# Patient Record
Sex: Female | Born: 1945 | Race: White | Hispanic: No | Marital: Married | Smoking: Never smoker
Health system: Southern US, Community
[De-identification: ages and names within clinical notes are randomized; demographics above are authoritative.]

## PROBLEM LIST (undated history)

## (undated) DIAGNOSIS — E049 Nontoxic goiter, unspecified: Secondary | ICD-10-CM

## (undated) DIAGNOSIS — K219 Gastro-esophageal reflux disease without esophagitis: Secondary | ICD-10-CM

## (undated) DIAGNOSIS — Z789 Other specified health status: Secondary | ICD-10-CM

## (undated) DIAGNOSIS — K5900 Constipation, unspecified: Secondary | ICD-10-CM

## (undated) DIAGNOSIS — M722 Plantar fascial fibromatosis: Secondary | ICD-10-CM

## (undated) DIAGNOSIS — M1990 Unspecified osteoarthritis, unspecified site: Secondary | ICD-10-CM

## (undated) DIAGNOSIS — C801 Malignant (primary) neoplasm, unspecified: Secondary | ICD-10-CM

## (undated) DIAGNOSIS — J309 Allergic rhinitis, unspecified: Secondary | ICD-10-CM

## (undated) DIAGNOSIS — M810 Age-related osteoporosis without current pathological fracture: Secondary | ICD-10-CM

## (undated) DIAGNOSIS — IMO0002 Reserved for concepts with insufficient information to code with codable children: Secondary | ICD-10-CM

## (undated) DIAGNOSIS — E785 Hyperlipidemia, unspecified: Secondary | ICD-10-CM

## (undated) HISTORY — PX: BASAL CELL CARCINOMA EXCISION: SHX1214

## (undated) HISTORY — PX: TONSILLECTOMY: SUR1361

## (undated) HISTORY — PX: NOSE SURGERY: SHX723

## (undated) HISTORY — PX: CATARACT EXTRACTION: SUR2

---

## 2007-06-27 HISTORY — PX: RETINAL LASER PROCEDURE: SHX2339

## 2007-07-26 ENCOUNTER — Ambulatory Visit (HOSPITAL_COMMUNITY): Admission: RE | Admit: 2007-07-26 | Discharge: 2007-07-27 | Payer: Self-pay | Admitting: Ophthalmology

## 2011-02-08 NOTE — Op Note (Signed)
Jody Howe, Jody Howe              ACCOUNT NO.:  [id number]   MEDICAL RECORD NO.:  [id number]          PATIENT TYPE:  AMB   LOCATION:  SDS                          FACILITY:  [facility]   PHYSICIAN:  John D. Ashley Royalty, M.D. DATE OF BIRTH:  09-Dec-1945   DATE OF PROCEDURE:  07/26/2007  DATE OF DISCHARGE:                               OPERATIVE REPORT   ADMISSION DIAGNOSIS:  Preretinal fibrosis, right eye.   PROCEDURE:  Pars plana vitrectomy, membrane peel, retinal  photocoagulation, right eye.   SURGEON:  Beulah Gandy. Ashley Royalty, M.D.   ASSISTANT:  Rosalie Doctor, M.A.   ANESTHESIA:  General.   DETAILS:  Usual prep and drape.  The indirect ophthalmoscope laser was  moved into place, and 319 burns were placed in two rows around the  retinal periphery with a power of 420 milliwatts, 1000 microns each, and  0.07 seconds each.  Sclerotomies were marked at 8, 10, and 2 o'clock.  A  three-layered incision was made in each of these locations.  Infusion at  8 o'clock.  Contact lens ring anchored into place at 6 and 12 o'clock.  Provisc placed on the corneal surface, and the flat contact lens was  placed.  Pars plana vitrectomy was begun just behind the crystalline  lens.  Vitrectomy was carried posteriorly.  White membranes were  encountered.  The retina was thrown into folds in the macular region in  a radial fashion.  A D-shaped ring of fibrotic tissue cover the macula  and extended to the disk.  The diamond dusted membrane scraper was used  to engage the membrane and roll an edge up from the lower arcade.  The  20-gauge forceps were then used to grasp the membrane and peel it from  its attachment to the disk, the papillomacular bundle, and the macular  region.  The membrane was peeled across it in one piece.  The membrane  was withdrawn from the eye with 20-gauge forceps.  The vitrectomy was  continued out into the peripheral vitreous area on the vitreous base.  A  30-degree prismatic lens was moved  into place, and all vitreous was  removed down to the vitreous base.  Once this was accomplished, the  instruments were removed from the eye.  9-0 nylon was used to close the  sclerotomy sites.  They were tested and found to be tight.  9-0 nylon  was used to close the sites.  The conjunctiva was closed with wet-field  cautery.  Polymyxin and gentamicin were irrigated into Tenon's space.  Marcaine was injected around the globe for postop pain.  Decadron 10 mg  was injected into the lower subconjunctival space.  Closing pressure was  10 with a Risk manager.   COMPLICATIONS:  None.   DURATION:  1 hour.  The patient was awakened and taken to recovery in  satisfactory condition.      Beulah Gandy. Ashley Royalty, M.D.  Electronically Signed    JDM/MEDQ  D:  07/26/2007  T:  07/27/2007  Job:  [id number]

## 2011-07-06 LAB — CBC
HCT: 41.4
Platelets: 238
WBC: 4.8

## 2011-07-06 LAB — BASIC METABOLIC PANEL
BUN: 13
GFR calc non Af Amer: >60
Potassium: 3.8
Sodium: 138

## 2011-10-19 ENCOUNTER — Other Ambulatory Visit: Payer: Self-pay | Admitting: Endocrinology

## 2011-10-19 DIAGNOSIS — E042 Nontoxic multinodular goiter: Secondary | ICD-10-CM

## 2011-10-26 ENCOUNTER — Encounter (HOSPITAL_COMMUNITY)
Admission: RE | Admit: 2011-10-26 | Discharge: 2011-10-26 | Disposition: A | Discharge status: Home / Self Care | Payer: BC Managed Care – PPO | Source: Ambulatory Visit | Attending: Endocrinology | Admitting: Endocrinology

## 2011-10-26 DIAGNOSIS — E042 Nontoxic multinodular goiter: Secondary | ICD-10-CM | POA: Insufficient documentation

## 2011-10-26 MED ORDER — SODIUM PERTECHNETATE TC 99M INJECTION
10.0000 | Freq: Once | INTRAVENOUS | Status: AC | PRN
  Administered 2011-10-26: 10 via INTRAVENOUS

## 2012-03-28 DIAGNOSIS — Z85828 Personal history of other malignant neoplasm of skin: Secondary | ICD-10-CM | POA: Insufficient documentation

## 2013-06-18 ENCOUNTER — Encounter (INDEPENDENT_AMBULATORY_CARE_PROVIDER_SITE_OTHER): Payer: 59 | Admitting: Ophthalmology

## 2013-06-18 DIAGNOSIS — H2510 Age-related nuclear cataract, unspecified eye: Secondary | ICD-10-CM

## 2013-06-18 DIAGNOSIS — H35379 Puckering of macula, unspecified eye: Secondary | ICD-10-CM

## 2013-06-18 DIAGNOSIS — H43819 Vitreous degeneration, unspecified eye: Secondary | ICD-10-CM

## 2013-10-17 ENCOUNTER — Other Ambulatory Visit: Payer: BC Managed Care – PPO

## 2013-10-22 ENCOUNTER — Ambulatory Visit: Payer: BC Managed Care – PPO | Admitting: Endocrinology

## 2013-10-28 ENCOUNTER — Encounter: Payer: Self-pay | Admitting: Endocrinology

## 2013-10-28 ENCOUNTER — Ambulatory Visit (INDEPENDENT_AMBULATORY_CARE_PROVIDER_SITE_OTHER): Payer: Commercial Managed Care - PPO | Admitting: Endocrinology

## 2013-10-28 VITALS — BP 122/60 | HR 77 | Temp 98.2°F | Resp 16 | Ht 59.5 in | Wt 147.4 lb

## 2013-10-28 DIAGNOSIS — E041 Nontoxic single thyroid nodule: Secondary | ICD-10-CM | POA: Insufficient documentation

## 2013-10-28 NOTE — Progress Notes (Signed)
Patient ID: Jody Howe, female   DOB: November 18, 1945, 68 y.o.   MRN: [id number]    Reason for Appointment: Goiter, followup    History of Present Illness:   The patient's thyroid enlargement was first discovered in 2008 Apparently her thyroid swelling was increased a couple of years later and her ultrasound showed a dominant right-sided 2.7 cm nodule. Biopsy revealed colloid and benign follicular cells has also scattered Hurthle cells in October 2010 Apparently on followup ultrasound exams she appeared to have coalescence of her nodules and in 2012 the entire lobe appeared to be a single nodular structure measuring 4.2 cm in size. This appeared cold on the nuclear scan in 09/2011; the record area was mostly in the right inferior lobe. The isthmus was showing normal uptake. She also had a 1.9 cm nodule to the right of the isthmus on the ultrasound.  She has had no difficulty with swallowing  Does not feel like she has any choking sensation in her neck or pressure in any position or when lying down. She was last seen in 09/2012 when her thyroid nodule appeared to be 4.5 cm smooth and slightly firm on the right side. Also had a 2 cm slightly firm nodule in the right of the isthmus.  TSH done by PCP in 08/2013 was 1.1  No results found for this basename: TSH       Medication List       This list is accurate as of: 10/28/13 11:59 PM.  Always use your most recent med list.               alendronate 70 MG tablet  Commonly known as:  FOSAMAX  Take 70 mg by mouth once a week. Take with a full glass of water on an empty stomach.        Allergies:  Allergies  Allergen Reactions  . Sulfa Antibiotics Hives    No past medical history on file.  No past surgical history on file.  No family history on file.  Social History:  reports that she has never smoked. She has never used smokeless tobacco. Her alcohol and drug histories are not on file.   Review of Systems:  There is no  history of high blood pressure.             No  history of Diabetes.             Examination:   BP 122/60  Pulse 77  Temp(Src) 98.2 F (36.8 C)  Resp 16  Ht 4' 11.5" (1.511 m)  Wt 147 lb 6.4 oz (66.86 kg)  BMI 29.28 kg/m2  SpO2 98%   General Appearance: pleasant,          Eyes: No abnormal prominence       Neck: The thyroid is enlarged, appears to have a 3.5-4 cm nodule on the medial right lobe extending to the isthmus which is smooth and not nodular. No separate nodules felt otherwise. Left lobe not enlarged Neurological: REFLEXES: at biceps are normal.        Assessment/Plan:  Multinodular goiter since at least 2008 Clinically her right-sided thyroid enlargement and appears to be the same or smaller compared to a year ago Since she already has had a biopsy indicating a benign process and the nodule is stable we'll continue to follow her annually She is also euthyroid consistently on her TSH measurements   [facility] 11/04/2013

## 2014-06-19 ENCOUNTER — Ambulatory Visit (INDEPENDENT_AMBULATORY_CARE_PROVIDER_SITE_OTHER): Payer: 59 | Admitting: Ophthalmology

## 2014-07-07 ENCOUNTER — Ambulatory Visit (INDEPENDENT_AMBULATORY_CARE_PROVIDER_SITE_OTHER): Payer: 59 | Admitting: Ophthalmology

## 2014-07-07 DIAGNOSIS — H35373 Puckering of macula, bilateral: Secondary | ICD-10-CM

## 2014-07-07 DIAGNOSIS — H2512 Age-related nuclear cataract, left eye: Secondary | ICD-10-CM

## 2014-07-07 DIAGNOSIS — H43813 Vitreous degeneration, bilateral: Secondary | ICD-10-CM

## 2014-11-26 ENCOUNTER — Encounter: Payer: Self-pay | Admitting: Endocrinology

## 2014-11-26 ENCOUNTER — Other Ambulatory Visit: Payer: Self-pay | Admitting: *Deleted

## 2014-11-26 ENCOUNTER — Other Ambulatory Visit (INDEPENDENT_AMBULATORY_CARE_PROVIDER_SITE_OTHER): Payer: Commercial Managed Care - PPO

## 2014-11-26 ENCOUNTER — Ambulatory Visit (INDEPENDENT_AMBULATORY_CARE_PROVIDER_SITE_OTHER): Payer: Commercial Managed Care - PPO | Admitting: Endocrinology

## 2014-11-26 VITALS — BP 112/70 | HR 63 | Temp 98.2°F | Resp 14 | Ht 59.5 in | Wt 132.6 lb

## 2014-11-26 DIAGNOSIS — E041 Nontoxic single thyroid nodule: Secondary | ICD-10-CM

## 2014-11-26 LAB — TSH: TSH: 1.54 u[IU]/mL (ref 0.35–4.50)

## 2014-11-26 LAB — T4, FREE: FREE T4: 0.73 ng/dL (ref 0.60–1.60)

## 2014-11-26 NOTE — Progress Notes (Signed)
Patient ID: Jody Howe, female   DOB: 06-26-1946, 69 y.o.   MRN: [id number]    Reason for Appointment: Goiter, followup    History of Present Illness:   The patient's thyroid enlargement was first discovered in 2008 Apparently her thyroid swelling was increased a couple of years later and her ultrasound showed a dominant right-sided 2.7 cm nodule. Biopsy revealed colloid and benign follicular cells has also scattered Hurthle cells in October 2010 Apparently on followup ultrasound exams she appeared to have coalescence of her nodules and in 2012 the entire lobe appeared to be a single nodular structure measuring 4.2 cm in size. This appeared cold on the nuclear scan in 09/2011; the record area was mostly in the right inferior lobe. The isthmus was showing normal uptake. She also had a 1.9 cm nodule to the right of the isthmus on the ultrasound.  She has had no difficulty with swallowing   Does not feel have  any choking sensation in her neck or pressure in any position or when lying down. She was last seen in 09/2013 when her thyroid nodule appeared to be 3.5-4 cm smooth and firm on the right side.  Also had a 2 cm slightly firm nodule in the right of the isthmus previously which was not palpable.  She thinks that her nodule is not as prominent when she looks at it  TSH done by PCP in 08/2013 was 1.1, no recent labs available  Lab Results  Component Value Date   TSH 1.54 11/26/2014       Medication List       This list is accurate as of: 11/26/14  5:18 PM.  Always use your most recent med list.               calcium citrate-vitamin D 315-200 MG-UNIT per tablet  Commonly known as:  CITRACAL+D  Take 2 tablets by mouth 2 (two) times daily.        Allergies:  Allergies  Allergen Reactions  . Sulfa Antibiotics Hives    No past medical history on file.  No past surgical history on file.  No family history on file.  Social History:  reports that she has never smoked.  She has never used smokeless tobacco. Her alcohol and drug histories are not on file.   Review of Systems:  There is no history of high blood pressure.             No  history of Diabetes.             Examination:   BP 112/70 mmHg  Pulse 63  Temp(Src) 98.2 F (36.8 C)  Resp 14  Ht 4' 11.5" (1.511 m)  Wt 132 lb 9.6 oz (60.147 kg)  BMI 26.34 kg/m2  SpO2 98%   General Appearance: pleasant, looks well         Neck: The thyroid is enlarged on the right side and has a 3-3.5 cm nodule on the right lobe medially extending to the isthmus; this is smooth and not nodular. No separate nodules felt otherwise. Left lobe not enlarged Neurological: REFLEXES: at biceps are normal.      Assessment/Plan:  Multinodular goiter since at least 2008 Clinically her right-sided thyroid enlargement and appears to be progressively smaller compared to 2 years ago Since she already has had a biopsy indicating a benign etiology   She is also euthyroid consistently on her TSH measurements and this will be rechecked She can be followed every  2 years now   [facility] 11/26/2014

## 2014-11-27 NOTE — Progress Notes (Signed)
Quick Note:  Please let patient know that the lab result is normal and no further action needed ______ 

## 2015-07-13 ENCOUNTER — Ambulatory Visit (INDEPENDENT_AMBULATORY_CARE_PROVIDER_SITE_OTHER): Payer: Medicare Other | Admitting: Ophthalmology

## 2015-07-13 DIAGNOSIS — H43813 Vitreous degeneration, bilateral: Secondary | ICD-10-CM

## 2015-07-13 DIAGNOSIS — H35373 Puckering of macula, bilateral: Secondary | ICD-10-CM | POA: Diagnosis not present

## 2015-09-22 ENCOUNTER — Ambulatory Visit (INDEPENDENT_AMBULATORY_CARE_PROVIDER_SITE_OTHER): Payer: Medicare Other | Admitting: Sports Medicine

## 2015-09-22 ENCOUNTER — Encounter: Payer: Self-pay | Admitting: Sports Medicine

## 2015-09-22 ENCOUNTER — Ambulatory Visit: Payer: Self-pay

## 2015-09-22 DIAGNOSIS — M79672 Pain in left foot: Secondary | ICD-10-CM | POA: Diagnosis not present

## 2015-09-22 DIAGNOSIS — Q828 Other specified congenital malformations of skin: Secondary | ICD-10-CM

## 2015-09-22 NOTE — Progress Notes (Signed)
Patient ID: Jody Howe, female   DOB: 07-06-1946, 69 y.o.   MRN: [id number] Subjective: Jody Howe is a 69 y.o. female patient who presents to office for evaluation ofLeft foot pain. Patient complains of pain at the callus lesions present Left foot at the ball. Patient states that she tried trimming once; denies any other pedal complaints.   Patient Active Problem List   Diagnosis Date Noted  . Thyroid nodule 10/28/2013   Current Outpatient Prescriptions on File Prior to Visit  Medication Sig Dispense Refill  . calcium citrate-vitamin D (CITRACAL+D) 315-200 MG-UNIT per tablet Take 2 tablets by mouth 2 (two) times daily.     No current facility-administered medications on file prior to visit.   Allergies  Allergen Reactions  . Sulfa Antibiotics Hives     Objective:  General: Alert and oriented x3 in no acute distress  Dermatology: Keratotic lesions present sub met 3-4 on left with central nucleated core noted, no signs of infection, no webspace macerations, no ecchymosis bilateral, all nails x 10 are well manicured.  Vascular: Dorsalis Pedis and Posterior Tibial pedal pulses 2/4, Capillary Fill Time 3 seconds, + pedal hair growth bilateral, no edema bilateral lower extremities, Temperature gradient within normal limits.  Neurology: Johney Maine sensation intact via light touch bilateral.  Musculoskeletal: Mild tenderness with palpation at the keratotic lesions on Left plantar 3-4 met heads, mild hammertoes with distal fat pad migration, Muscular strength 5/5 in all groups without pain or limitation on range of motion.     Assessment and Plan: Problem List Items Addressed This Visit    None    Visit Diagnoses    Left foot pain    -  Primary    Porokeratosis           -Complete examination performed -Discussed treatement options -Parred keratoic lesions using a chisel blade; treated the area with Salinocaine covered with moleskin; Advised patient to keep intact for 1 day  and to replace as needed -Recommend good supportive shoes for foot type and advised OTC inserts to slow recurrence and daily skin emollinets. Recommend good supportive work boots as well and good foot hygiene.  -Patient to return to office 6 weeks or sooner if condition worsens.  Landis Martins, DPM

## 2015-11-03 ENCOUNTER — Ambulatory Visit: Payer: Medicare Other | Admitting: Sports Medicine

## 2015-11-10 ENCOUNTER — Ambulatory Visit: Payer: Medicare Other | Admitting: Sports Medicine

## 2015-11-17 ENCOUNTER — Ambulatory Visit (INDEPENDENT_AMBULATORY_CARE_PROVIDER_SITE_OTHER): Payer: Medicare Other | Admitting: Sports Medicine

## 2015-11-17 ENCOUNTER — Encounter: Payer: Self-pay | Admitting: Sports Medicine

## 2015-11-17 DIAGNOSIS — M79672 Pain in left foot: Secondary | ICD-10-CM

## 2015-11-17 DIAGNOSIS — Q828 Other specified congenital malformations of skin: Secondary | ICD-10-CM | POA: Diagnosis not present

## 2015-11-17 NOTE — Progress Notes (Signed)
Patient ID: Jody Howe, female   DOB: 1946/07/29, 70 y.o.   MRN: [id number] Subjective: Jody Howe is a 70 y.o. female patient who returns to office for evaluation of Left foot pain/callus. Patient states that she has not had a recurrence of pain since last encounter; denies any other pedal complaints.   Patient Active Problem List   Diagnosis Date Noted  . Thyroid nodule 10/28/2013   Current Outpatient Prescriptions on File Prior to Visit  Medication Sig Dispense Refill  . calcium citrate-vitamin D (CITRACAL+D) 315-200 MG-UNIT per tablet Take 2 tablets by mouth 2 (two) times daily.     No current facility-administered medications on file prior to visit.   Allergies  Allergen Reactions  . Sulfa Antibiotics Hives     Objective:  General: Alert and oriented x3 in no acute distress  Dermatology: Resolved Keratotic lesions sub met 3-4 on left, no signs of infection, no webspace macerations, no ecchymosis bilateral, all nails x 10 are well manicured.  Vascular: Dorsalis Pedis and Posterior Tibial pedal pulses 2/4, Capillary Fill Time 3 seconds, + pedal hair growth bilateral, no edema bilateral lower extremities, Temperature gradient within normal limits.  Neurology: Johney Maine sensation intact via light touch bilateral.  Musculoskeletal: No tenderness with palpation at the Left plantar 3-4 met heads, mild hammertoes with distal fat pad migration, Muscular strength 5/5 in all groups without pain or limitation on range of motion.     Assessment and Plan: Problem List Items Addressed This Visit    None    Visit Diagnoses    Left foot pain    -  Primary    Resolved    Porokeratosis        Improved       -Complete examination performed -Discussed long term care -Recommend good supportive shoes for foot type and advised OTC inserts to slow recurrence and daily skin emollinets. Recommend good supportive work boots as well and good foot hygiene.  -Patient to return to office as  needed or sooner if condition worsens.  Landis Martins, DPM

## 2015-11-17 NOTE — Patient Instructions (Signed)
[employer]'s Healthy Feet foot cream daily after showering

## 2016-02-12 ENCOUNTER — Encounter: Payer: Self-pay | Admitting: *Deleted

## 2016-02-15 ENCOUNTER — Encounter: Payer: Self-pay | Admitting: *Deleted

## 2016-02-15 ENCOUNTER — Ambulatory Visit: Payer: Medicare Other | Admitting: Certified Registered Nurse Anesthetist

## 2016-02-15 ENCOUNTER — Ambulatory Visit
Admission: RE | Admit: 2016-02-15 | Discharge: 2016-02-15 | Disposition: A | Discharge status: Home / Self Care | Payer: Medicare Other | Source: Ambulatory Visit | Attending: Gastroenterology | Admitting: Gastroenterology

## 2016-02-15 ENCOUNTER — Encounter
Admission: RE | Disposition: A | Discharge status: Home / Self Care | Payer: Self-pay | Source: Ambulatory Visit | Attending: Gastroenterology

## 2016-02-15 DIAGNOSIS — Z79899 Other long term (current) drug therapy: Secondary | ICD-10-CM | POA: Diagnosis not present

## 2016-02-15 DIAGNOSIS — Z86010 Personal history of colonic polyps: Secondary | ICD-10-CM | POA: Insufficient documentation

## 2016-02-15 DIAGNOSIS — Z1211 Encounter for screening for malignant neoplasm of colon: Secondary | ICD-10-CM | POA: Insufficient documentation

## 2016-02-15 DIAGNOSIS — M1990 Unspecified osteoarthritis, unspecified site: Secondary | ICD-10-CM | POA: Insufficient documentation

## 2016-02-15 HISTORY — DX: Plantar fascial fibromatosis: M72.2

## 2016-02-15 HISTORY — DX: Constipation, unspecified: K59.00

## 2016-02-15 HISTORY — DX: Age-related osteoporosis without current pathological fracture: M81.0

## 2016-02-15 HISTORY — DX: Unspecified osteoarthritis, unspecified site: M19.90

## 2016-02-15 HISTORY — DX: Gastro-esophageal reflux disease without esophagitis: K21.9

## 2016-02-15 HISTORY — DX: Allergic rhinitis, unspecified: J30.9

## 2016-02-15 HISTORY — DX: Other specified health status: Z78.9

## 2016-02-15 HISTORY — DX: Reserved for concepts with insufficient information to code with codable children: IMO0002

## 2016-02-15 HISTORY — PX: COLONOSCOPY WITH PROPOFOL: SHX5780

## 2016-02-15 HISTORY — DX: Nontoxic goiter, unspecified: E04.9

## 2016-02-15 SURGERY — COLONOSCOPY WITH PROPOFOL
Anesthesia: General

## 2016-02-15 MED ORDER — LIDOCAINE HCL (CARDIAC) 20 MG/ML IV SOLN
INTRAVENOUS | Status: DC | PRN
  Administered 2016-02-15: 20 mg via INTRAVENOUS

## 2016-02-15 MED ORDER — EPHEDRINE SULFATE 50 MG/ML IJ SOLN
INTRAMUSCULAR | Status: DC | PRN
  Administered 2016-02-15 (×2): 5 mg via INTRAVENOUS

## 2016-02-15 MED ORDER — SODIUM CHLORIDE 0.9 % IV SOLN
INTRAVENOUS | Status: DC
Start: 2016-02-15 — End: 2016-02-15
  Administered 2016-02-15: 09:00:00 via INTRAVENOUS

## 2016-02-15 MED ORDER — SODIUM CHLORIDE 0.9 % IV SOLN: INTRAVENOUS | Status: DC

## 2016-02-15 MED ORDER — PROPOFOL 10 MG/ML IV BOLUS
INTRAVENOUS | Status: DC | PRN
  Administered 2016-02-15: 50 mg via INTRAVENOUS

## 2016-02-15 MED ORDER — PROPOFOL 500 MG/50ML IV EMUL
INTRAVENOUS | Status: DC | PRN
  Administered 2016-02-15: 150 ug/kg/min via INTRAVENOUS

## 2016-02-15 NOTE — Anesthesia Preprocedure Evaluation (Signed)
Anesthesia Evaluation  Patient identified by MRN, date of birth, ID band Patient awake    Reviewed: Allergy & Precautions, H&P , NPO status , Patient's Chart, lab work & pertinent test results, reviewed documented beta blocker date and time   Airway Mallampati: II   Neck ROM: full    Dental  (+) Poor Dentition, Teeth Intact   Pulmonary neg pulmonary ROS,    Pulmonary exam normal        Cardiovascular negative cardio ROS Normal cardiovascular exam Rhythm:regular Rate:Normal     Neuro/Psych negative neurological ROS  negative psych ROS   GI/Hepatic negative GI ROS, Neg liver ROS,   Endo/Other  negative endocrine ROS  Renal/GU negative Renal ROS  negative genitourinary   Musculoskeletal   Abdominal   Peds  Hematology negative hematology ROS (+)   Anesthesia Other Findings Past Medical History:   Constipation                                                 Goiter                                                       Allergic rhinitis                                            Osteoporosis                                                 Plantar fasciitis                                            Cystocele                                                      Comment:mild on exam   Arthritis                                                    Medical history non-contributory                           Past Surgical History:   TONSILLECTOMY                                                 RETINAL LASER PROCEDURE  10/08          Comment:laster retinal surgery   BASAL CELL CARCINOMA EXCISION                                 CATARACT EXTRACTION                                           NOSE SURGERY                                                    Comment:excision and flap - [facility] left nose BMI    Body Mass Index   26.24 kg/m 2     Reproductive/Obstetrics                              Anesthesia Physical Anesthesia Plan  ASA: II  Anesthesia Plan: General   Post-op Pain Management:    Induction:   Airway Management Planned:   Additional Equipment:   Intra-op Plan:   Post-operative Plan:   Informed Consent: I have reviewed the patients History and Physical, chart, labs and discussed the procedure including the risks, benefits and alternatives for the proposed anesthesia with the patient or authorized representative who has indicated his/her understanding and acceptance.   Dental Advisory Given  Plan Discussed with: CRNA  Anesthesia Plan Comments:         Anesthesia Quick Evaluation

## 2016-02-15 NOTE — Op Note (Signed)
[facility]astroenterology Patient Name: Jody Howe Procedure Date: 02/15/2016 9:35 AM MRN: [id number] Account #: [id number] Date of Birth: 05-05-1946 Admit Type: Outpatient Age: 70 Room: [facility] ENDO ROOM 4 Gender: Female Note Status: Finalized Procedure:            Colonoscopy Indications:          Personal history of colonic polyps Providers:            Lupita Dawn. Candace Cruise, MD Referring MD:         Renee Rival (Referring MD) Medicines:            Monitored Anesthesia Care Complications:        No immediate complications. Procedure:            Pre-Anesthesia Assessment:                       - Prior to the procedure, a History and Physical was                        performed, and patient medications, allergies and                        sensitivities were reviewed. The patient's tolerance of                        previous anesthesia was reviewed.                       - The risks and benefits of the procedure and the                        sedation options and risks were discussed with the                        patient. All questions were answered and informed                        consent was obtained.                       - After reviewing the risks and benefits, the patient                        was deemed in satisfactory condition to undergo the                        procedure.                       After obtaining informed consent, the colonoscope was                        passed under direct vision. Throughout the procedure,                        the patient's blood pressure, pulse, and oxygen                        saturations were monitored continuously. The  Colonoscope was introduced through the anus and                        advanced to the the cecum, identified by appendiceal                        orifice and ileocecal valve. The colonoscopy was                        performed without difficulty. The patient  tolerated the                        procedure well. The quality of the bowel preparation                        was good. Findings:      The colon (entire examined portion) appeared normal. Impression:           - The entire examined colon is normal.                       - No specimens collected. Recommendation:       - Discharge patient to home.                       - Repeat colonoscopy in 5 years for surveillance.                       - The findings and recommendations were discussed with                        the patient. Procedure Code(s):    --- Professional ---                       [phone], Colonoscopy, flexible; diagnostic, including                        collection of specimen(s) by brushing or washing, when                        performed (separate procedure) Diagnosis Code(s):    --- Professional ---                       Z86.010, Personal history of colonic polyps CPT copyright 2016 [employer] Medical Association. All rights reserved. The codes documented in this report are preliminary and upon coder review may  be revised to meet current compliance requirements. Hulen Luster, MD 02/15/2016 9:58:25 AM This report has been signed electronically. Number of Addenda: 0 Note Initiated On: 02/15/2016 9:35 AM Scope Withdrawal Time: 0 hours 5 minutes 46 seconds  Total Procedure Duration: 0 hours 11 minutes 2 seconds       [facility]

## 2016-02-15 NOTE — Anesthesia Postprocedure Evaluation (Signed)
Anesthesia Post Note  Patient: Jody Howe  Procedure(s) Performed: Procedure(s) (LRB): COLONOSCOPY WITH PROPOFOL (N/A)  Patient location during evaluation: PACU Anesthesia Type: General Level of consciousness: awake and alert Pain management: pain level controlled Vital Signs Assessment: post-procedure vital signs reviewed and stable Respiratory status: spontaneous breathing, nonlabored ventilation, respiratory function stable and patient connected to nasal cannula oxygen Cardiovascular status: blood pressure returned to baseline and stable Postop Assessment: no signs of nausea or vomiting Anesthetic complications: no    Last Vitals:  Filed Vitals:   02/15/16 1020 02/15/16 1030  BP: 95/55 106/58  Pulse: 62 59  Temp:    Resp: 11 19    Last Pain: There were no vitals filed for this visit.               Molli Barrows

## 2016-02-15 NOTE — Transfer of Care (Signed)
Immediate Anesthesia Transfer of Care Note  Patient: Jody Howe  Procedure(s) Performed: Procedure(s): COLONOSCOPY WITH PROPOFOL (N/A)  Patient Location: PACU  Anesthesia Type:General  Level of Consciousness: awake, alert  and oriented  Airway & Oxygen Therapy: Patient Spontanous Breathing and Patient connected to nasal cannula oxygen  Post-op Assessment: Report given to RN and Post -op Vital signs reviewed and stable  Post vital signs: Reviewed and stable  Last Vitals:  Filed Vitals:   02/15/16 0858 02/15/16 1000  BP: 101/63 90/55  Pulse: 58 71  Temp: 36.9 C 35.9 C  Resp: 18 18    Last Pain: There were no vitals filed for this visit.       Complications: No apparent anesthesia complications

## 2016-02-15 NOTE — H&P (Addendum)
    Primary Care Physician:  Renee Rival, NP Primary Gastroenterologist:  Dr. Candace Cruise  Pre-Procedure History & Physical: HPI:  Jody Howe is a 70 y.o. female is here for an colonoscopy.   Past Medical History  Diagnosis Date  . Constipation   . Goiter   . Allergic rhinitis   . Osteoporosis   . Plantar fasciitis   . Cystocele     mild on exam  . Arthritis   . Medical history non-contributory     Past Surgical History  Procedure Laterality Date  . Tonsillectomy    . Retinal laser procedure  10/08    laster retinal surgery  . Basal cell carcinoma excision    . Cataract extraction    . Nose surgery      excision and flap - [facility] left nose    Prior to Admission medications   Medication Sig Start Date End Date Taking? Authorizing Provider  calcium citrate-vitamin D (CITRACAL+D) 315-200 MG-UNIT per tablet Take 2 tablets by mouth 2 (two) times daily.   Yes Historical Provider, MD  Multiple Vitamins-Minerals (MULTIVITAMIN WITH MINERALS) tablet Take by mouth.   Yes Historical Provider, MD  amoxicillin-clavulanate (AUGMENTIN) 875-125 MG tablet Reported on 02/15/2016 11/12/15   Historical Provider, MD  trimethoprim-polymyxin b (POLYTRIM) ophthalmic solution Reported on 02/15/2016 11/12/15   Historical Provider, MD    Allergies as of 01/26/2016 - Review Complete 11/17/2015  Allergen Reaction Noted  . Sulfa antibiotics Hives 10/28/2013    History reviewed. No pertinent family history.  Social History   Social History  . Marital Status: Married    Spouse Name: N/A  . Number of Children: N/A  . Years of Education: N/A   Occupational History  . Not on file.   Social History Main Topics  . Smoking status: Never Smoker   . Smokeless tobacco: Never Used  . Alcohol Use: No  . Drug Use: No  . Sexual Activity: Not on file   Other Topics Concern  . Not on file   Social History Narrative    Review of Systems: See HPI, otherwise negative ROS  Physical Exam: BP  101/63 mmHg  Pulse 58  Temp(Src) 98.5 F (36.9 C) (Tympanic)  Resp 18  Ht 4\' 11"  (1.499 m)  Wt 130 lb (58.968 kg)  BMI 26.24 kg/m2  SpO2 100% General:   Alert,  pleasant and cooperative in NAD Head:  Normocephalic and atraumatic. Neck:  Supple; no masses or thyromegaly. Lungs:  Clear throughout to auscultation.    Heart:  Regular rate and rhythm. Abdomen:  Soft, nontender and nondistended. Normal bowel sounds, without guarding, and without rebound.   Neurologic:  Alert and  oriented x4;  grossly normal neurologically.  Impression/Plan: Jody Howe is here for an colonoscopy to be performed for personal hx of colon polyps  Risks, benefits, limitations, and alternatives regarding colonoscopy have been reviewed with the patient.  Questions have been answered.  All parties agreeable.   Fredrica Capano, Lupita Dawn, MD  02/15/2016, 9:34 AM

## 2016-02-16 ENCOUNTER — Encounter: Payer: Self-pay | Admitting: Gastroenterology

## 2016-06-08 DIAGNOSIS — M5441 Lumbago with sciatica, right side: Secondary | ICD-10-CM | POA: Insufficient documentation

## 2016-06-08 DIAGNOSIS — M5136 Other intervertebral disc degeneration, lumbar region: Secondary | ICD-10-CM | POA: Insufficient documentation

## 2016-06-08 DIAGNOSIS — M51369 Other intervertebral disc degeneration, lumbar region without mention of lumbar back pain or lower extremity pain: Secondary | ICD-10-CM | POA: Insufficient documentation

## 2016-06-08 DIAGNOSIS — R899 Unspecified abnormal finding in specimens from other organs, systems and tissues: Secondary | ICD-10-CM | POA: Insufficient documentation

## 2016-07-18 ENCOUNTER — Ambulatory Visit (INDEPENDENT_AMBULATORY_CARE_PROVIDER_SITE_OTHER): Payer: Medicare Other | Admitting: Ophthalmology

## 2016-07-20 ENCOUNTER — Ambulatory Visit (INDEPENDENT_AMBULATORY_CARE_PROVIDER_SITE_OTHER): Payer: Medicare Other | Admitting: Ophthalmology

## 2016-08-22 ENCOUNTER — Ambulatory Visit (INDEPENDENT_AMBULATORY_CARE_PROVIDER_SITE_OTHER): Payer: Medicare Other | Admitting: Ophthalmology

## 2016-08-22 DIAGNOSIS — H35373 Puckering of macula, bilateral: Secondary | ICD-10-CM | POA: Diagnosis not present

## 2016-08-22 DIAGNOSIS — H43812 Vitreous degeneration, left eye: Secondary | ICD-10-CM

## 2016-11-28 ENCOUNTER — Encounter: Payer: Self-pay | Admitting: Podiatry

## 2016-11-28 ENCOUNTER — Ambulatory Visit (INDEPENDENT_AMBULATORY_CARE_PROVIDER_SITE_OTHER): Payer: Medicare Other | Admitting: Podiatry

## 2016-11-28 DIAGNOSIS — M79672 Pain in left foot: Secondary | ICD-10-CM | POA: Diagnosis not present

## 2016-11-28 DIAGNOSIS — Q828 Other specified congenital malformations of skin: Secondary | ICD-10-CM | POA: Diagnosis not present

## 2016-11-28 NOTE — Progress Notes (Signed)
   Subjective:    Patient ID: Jody Howe, female    DOB: 1946-01-12, 71 y.o.   MRN: [id number]  HPI this patient presents to the office with chief complaint of pain from skin lesion on the bottom of her left foot.She says that this keeps returning even after previous treatment.  She says that it is painful walking and wearing her shoes. He presents the office today for definitive evaluation and treatment    Review of Systems  All other systems reviewed and are negative.      Objective:   Physical Exam GENERAL APPEARANCE: Alert, conversant. Appropriately groomed. No acute distress.  VASCULAR: Pedal pulses are  palpable at  [facility] and PT bilateral.  Capillary refill time is immediate to all digits,  Normal temperature gradient.  Digital hair growth is present bilateral  NEUROLOGIC: sensation is normal to 5.07 monofilament at 5/5 sites bilateral.  Light touch is intact bilateral, Muscle strength normal.  MUSCULOSKELETAL: acceptable muscle strength, tone and stability bilateral.  Intrinsic muscluature intact bilateral.  Rectus appearance of foot and digits noted bilateral.   DERMATOLOGIC: skin color, texture, and turgor are within normal limits.  No preulcerative lesions or ulcers  are seen, no interdigital maceration noted.  No open lesions present.  Digital nails are asymptomatic. No drainage noted.Porokeratosis sub 2,3 left foot.         Assessment & Plan:  Porokeratosis  Left foot  Debride  Porokeratosis  RTC prn   Gardiner Barefoot DPM

## 2017-05-16 LAB — TSH: TSH: 0.01 — AB (ref <=5.90)

## 2017-10-03 ENCOUNTER — Encounter: Payer: Self-pay | Admitting: "Endocrinology

## 2017-10-03 ENCOUNTER — Ambulatory Visit: Payer: Medicare Other | Admitting: "Endocrinology

## 2017-10-03 VITALS — BP 128/67 | HR 85 | Ht 59.0 in | Wt 135.0 lb

## 2017-10-03 DIAGNOSIS — E0590 Thyrotoxicosis, unspecified without thyrotoxic crisis or storm: Secondary | ICD-10-CM | POA: Diagnosis not present

## 2017-10-03 DIAGNOSIS — E041 Nontoxic single thyroid nodule: Secondary | ICD-10-CM

## 2017-10-03 DIAGNOSIS — E0520 Thyrotoxicosis with toxic multinodular goiter without thyrotoxic crisis or storm: Secondary | ICD-10-CM | POA: Insufficient documentation

## 2017-10-03 NOTE — Progress Notes (Signed)
Consult Note                                            10/03/2017, 5:24 PM   Subjective:    Patient ID: Jody Howe, female    DOB: 09/13/1946, PCP Renee Rival, NP   Past Medical History:  Diagnosis Date  . Allergic rhinitis   . Arthritis   . Constipation   . Cystocele    mild on exam  . Goiter   . Medical history non-contributory   . Osteoporosis   . Plantar fasciitis    Past Surgical History:  Procedure Laterality Date  . BASAL CELL CARCINOMA EXCISION    . CATARACT EXTRACTION    . COLONOSCOPY WITH PROPOFOL N/A 02/15/2016   Procedure: COLONOSCOPY WITH PROPOFOL;  Surgeon: Hulen Luster, MD;  Location: [facility] ENDOSCOPY;  Service: Gastroenterology;  Laterality: N/A;  . NOSE SURGERY     excision and flap - [facility] left nose  . RETINAL LASER PROCEDURE  10/08   laster retinal surgery  . TONSILLECTOMY     Social History   Socioeconomic History  . Marital status: Married    Spouse name: None  . Number of children: None  . Years of education: None  . Highest education level: None  Social Needs  . Financial resource strain: None  . Food insecurity - worry: None  . Food insecurity - inability: None  . Transportation needs - medical: None  . Transportation needs - non-medical: None  Occupational History  . None  Tobacco Use  . Smoking status: Never Smoker  . Smokeless tobacco: Never Used  Substance and Sexual Activity  . Alcohol use: No  . Drug use: No  . Sexual activity: None  Other Topics Concern  . None  Social History Narrative  . None   Outpatient Encounter Medications as of 10/03/2017  Medication Sig  . denosumab (PROLIA) 60 MG/ML SOLN injection Inject 60 mg into the skin every 6 (six) months. Administer in upper arm, thigh, or abdomen  . calcium citrate-vitamin D (CITRACAL+D) 315-200 MG-UNIT per tablet Take 2 tablets by mouth 2 (two) times daily.  . [DISCONTINUED] amoxicillin-clavulanate (AUGMENTIN) 875-125 MG tablet Reported on 02/15/2016  .  [DISCONTINUED] Aspartame POWD Take by mouth.  . [DISCONTINUED] cyclobenzaprine (FLEXERIL) 5 MG tablet Begin only at night time, as may cause drowsiness. 1 tab PM for muscles spasm for 10 days  . [DISCONTINUED] Glucosamine-Chondroitin 500-400 MG CAPS Take by mouth.  . [DISCONTINUED] MAGNESIUM PO Take by mouth.  . [DISCONTINUED] Multiple Vitamins-Minerals (MULTIVITAMIN WITH MINERALS) tablet Take by mouth.  . [DISCONTINUED] Potassium Gluconate 550 (90 K) MG TABS Take by mouth.  . [DISCONTINUED] Red Yeast Rice Extract 600 MG CAPS Take by mouth.  . [DISCONTINUED] trimethoprim-polymyxin b (POLYTRIM) ophthalmic solution Reported on 02/15/2016  . [DISCONTINUED] Turmeric Curcumin 500 MG CAPS Take by mouth.   No facility-administered encounter medications on file as of 10/03/2017.    ALLERGIES: Allergies  Allergen Reactions  . Ceftin [Cefuroxime Axetil] Other (See Comments)  . Other Hives  . Sulfa Antibiotics Hives    VACCINATION STATUS: Immunization History  Administered Date(s) Administered  . Influenza-Unspecified 07/09/2014    HPI Jody Howe is 72 y.o. female who presents today with a medical history as above. she is being seen in consultation for history of multinodular goiter, recent subclinical  hyperthyroidism requested by Renee Rival, NP.  she has been dealing with symptoms of  mild tremors, weight loss of 10 pounds over the last 2 years ( although she thinks she lost waist due to her increased physical activity). She denies palpitations, sleep disturbance, heat or cold intolerance. She denies dysphagia, shortness of breath, nor voice change. she denies family history of thyroid dysfunction. No family history of thyroid cancer. - Around 2013, she was found to have cold nodule on her right thyroid which reportedly was biopsied and showed benign findings. She does not have any recent thyroid ultrasound nor thyroid imaging. - On 05/16/2017 she was found to have TSH < 0.006, free T4  1.63. She has history of osteoporosis on prolia treatment, currently after 2 doses.  Review of Systems  Constitutional: + weight loss, no fatigue, no subjective hyperthermia, no subjective hypothermia Eyes: no blurry vision, no xerophthalmia ENT: no sore throat, + nodules palpated  in her thyroid,  no dysphagia/odynophagia, no hoarseness Cardiovascular: no Chest Pain, no Shortness of Breath, no palpitations, no leg swelling Respiratory: no cough, no SOB Gastrointestinal: no Nausea/Vomiting/Diarhhea Musculoskeletal: no muscle/joint aches Skin: no rashes Neurological: + tremors ( did not notice until physical exam), no numbness, no tingling, no dizziness Psychiatric: no depression, no anxiety  Objective:    BP 128/67   Pulse 85   Ht '4\' 11"'  (1.499 m)   Wt 135 lb (61.2 kg)   BMI 27.27 kg/m   Wt Readings from Last 3 Encounters:  10/03/17 135 lb (61.2 kg)  02/15/16 130 lb (59 kg)  11/26/14 132 lb 9.6 oz (60.1 kg)    Physical Exam  Constitutional: +  slightly overweight for height,  not in acute distress, normal state of mind Eyes: PERRLA, EOMI, no exophthalmos ENT: moist mucous membranes, +  palpable asymmetric thyromegaly, right lobe larger than left lobe,  no cervical lymphadenopathy Cardiovascular: normal precordial activity, Regular Rate and Rhythm, no Murmur/Rubs/Gallops Respiratory:  adequate breathing efforts, no gross chest deformity, Clear to auscultation bilaterally Gastrointestinal: abdomen soft, Non -tender, No distension, Bowel Sounds present Musculoskeletal: no gross deformities, strength intact in all four extremities Skin: moist, warm, no rashes Neurological: ++ tremor with outstretched hands, Deep tendon reflexes normal in all four extremities.  CMP ( most recent) CMP     Component Value Date/Time   NA 138 07/26/2007 0931   K 3.8 07/26/2007 0931   CL 106 07/26/2007 0931   CO2 25 07/26/2007 0931   GLUCOSE 92 07/26/2007 0931   BUN 13 07/26/2007 0931    CREATININE 0.61 07/26/2007 0931   CALCIUM 8.6 07/26/2007 0931   GFRNONAA >60 07/26/2007 0931   GFRAA  07/26/2007 0931    >60        The eGFR has been calculated using the MDRD equation. This calculation has not been validated in all clinical   Review of her 2013 thyroid uptake and scan revealed a large cold nodule which was seen 2 arise from the inferior pole of the right lobe of the thyroid-reportedly biopsied with benign outcomes. Cytology reports are not available to review.   Lab Results  Component Value Date   TSH 0.01 (A) 05/16/2017   TSH 1.54 11/26/2014   FREET4 0.73 11/26/2014     Assessment & Plan:   1. Thyroid nodule 2. Subclinical hyperthyroidism  - Jody Howe  is being seen at a kind request of Renee Rival, NP- I have reviewed her available thyroid records and clinically evaluated the patient. -  She has history of nodular goiter, currently with subclinical hyperthyroidism. - She does not have recent workup, she will need repeat for thyroid function test as well as thyroid/neck ultrasound. She will return in 2 weeks to discuss results and make treatment decisions if necessary.   If her labs are suggestive of hyperthyroidism, she would be considered for thyroid uptake and scan to confirm diagnosis.  - I did not initiate any new prescriptions today. - I advised her to follow up with her PCP for treatment of osteoporosis currently on prolia a status post 2 doses.   - I advised patient to maintain close follow up with Renee Rival, NP for primary care needs. Follow up plan: Return in about 2 weeks (around 10/17/2017) for labs today, Thyroid / Neck Ultrasound.   Glade Lloyd, MD [facility] [address] [place], [place] [address] Phone: [phone]  Fax: [phone]     10/03/2017, 5:24 PM  This note was partially dictated with voice recognition software. Similar sounding words can be  transcribed inadequately or may not  be corrected upon review.

## 2017-10-04 LAB — T3, FREE: T3, Free: 8.7 pg/mL — ABNORMAL HIGH (ref 2.3–4.2)

## 2017-10-04 LAB — T4, FREE: FREE T4: 2.3 ng/dL — AB (ref 0.8–1.8)

## 2017-10-04 LAB — TSH: TSH: 0.01 mIU/L — ABNORMAL LOW (ref 0.40–4.50)

## 2017-10-04 LAB — THYROGLOBULIN ANTIBODY: Thyroglobulin Ab: <1 IU/mL (ref <=1)

## 2017-10-04 LAB — THYROID PEROXIDASE ANTIBODY: Thyroperoxidase Ab SerPl-aCnc: 1 IU/mL (ref <9)

## 2017-10-13 ENCOUNTER — Other Ambulatory Visit: Payer: Self-pay | Admitting: "Endocrinology

## 2017-10-13 ENCOUNTER — Ambulatory Visit (HOSPITAL_COMMUNITY)
Admission: RE | Admit: 2017-10-13 | Discharge: 2017-10-13 | Disposition: A | Discharge status: Home / Self Care | Payer: Medicare Other | Source: Ambulatory Visit | Attending: "Endocrinology | Admitting: "Endocrinology

## 2017-10-13 DIAGNOSIS — E041 Nontoxic single thyroid nodule: Secondary | ICD-10-CM | POA: Diagnosis not present

## 2017-10-17 ENCOUNTER — Ambulatory Visit (INDEPENDENT_AMBULATORY_CARE_PROVIDER_SITE_OTHER): Payer: Medicare Other | Admitting: "Endocrinology

## 2017-10-17 ENCOUNTER — Encounter: Payer: Self-pay | Admitting: "Endocrinology

## 2017-10-17 VITALS — BP 138/69 | HR 84 | Ht 59.0 in | Wt 133.0 lb

## 2017-10-17 DIAGNOSIS — E041 Nontoxic single thyroid nodule: Secondary | ICD-10-CM

## 2017-10-17 DIAGNOSIS — E0590 Thyrotoxicosis, unspecified without thyrotoxic crisis or storm: Secondary | ICD-10-CM

## 2017-10-17 NOTE — Progress Notes (Signed)
Consult Note                                            10/17/2017, 5:56 PM   Subjective:    Patient ID: Jody Howe, female    DOB: 08/31/1946, PCP Renee Rival, NP   Past Medical History:  Diagnosis Date  . Allergic rhinitis   . Arthritis   . Constipation   . Cystocele    mild on exam  . Goiter   . Medical history non-contributory   . Osteoporosis   . Plantar fasciitis    Past Surgical History:  Procedure Laterality Date  . BASAL CELL CARCINOMA EXCISION    . CATARACT EXTRACTION    . COLONOSCOPY WITH PROPOFOL N/A 02/15/2016   Procedure: COLONOSCOPY WITH PROPOFOL;  Surgeon: Hulen Luster, MD;  Location: [facility] ENDOSCOPY;  Service: Gastroenterology;  Laterality: N/A;  . NOSE SURGERY     excision and flap - [facility] left nose  . RETINAL LASER PROCEDURE  10/08   laster retinal surgery  . TONSILLECTOMY     Social History   Socioeconomic History  . Marital status: Married    Spouse name: None  . Number of children: None  . Years of education: None  . Highest education level: None  Social Needs  . Financial resource strain: None  . Food insecurity - worry: None  . Food insecurity - inability: None  . Transportation needs - medical: None  . Transportation needs - non-medical: None  Occupational History  . None  Tobacco Use  . Smoking status: Never Smoker  . Smokeless tobacco: Never Used  Substance and Sexual Activity  . Alcohol use: No  . Drug use: No  . Sexual activity: None  Other Topics Concern  . None  Social History Narrative  . None   Outpatient Encounter Medications as of 10/17/2017  Medication Sig  . cetirizine (ZYRTEC) 10 MG tablet Take 10 mg by mouth daily.  . fluticasone (FLONASE) 50 MCG/ACT nasal spray Place into both nostrils daily.  Marland Kitchen omeprazole (PRILOSEC) 20 MG capsule Take 20 mg by mouth daily.  . calcium citrate-vitamin D (CITRACAL+D) 315-200 MG-UNIT per tablet Take 2 tablets by mouth 2 (two) times daily.  Marland Kitchen denosumab (PROLIA) 60  MG/ML SOLN injection Inject 60 mg into the skin every 6 (six) months. Administer in upper arm, thigh, or abdomen   No facility-administered encounter medications on file as of 10/17/2017.    ALLERGIES: Allergies  Allergen Reactions  . Ceftin [Cefuroxime Axetil] Other (See Comments)  . Other Hives  . Sulfa Antibiotics Hives    VACCINATION STATUS: Immunization History  Administered Date(s) Administered  . Influenza-Unspecified 07/09/2014    HPI Jody Howe is 72 y.o. female who presents today with a medical history as above. - She is returning with repeat thyroid function tests and thyroid ultrasound for reevaluation. she has been dealing with symptoms of  mild tremors, weight loss of 10 pounds over the last 2 years ( although she thinks she lost waist due to her increased physical activity). She denies palpitations, sleep disturbance, heat or cold intolerance. She denies dysphagia, shortness of breath, nor voice change. - Her repeat thyroid function tests are consistent with hyperthyroidism. she denies family history of thyroid dysfunction. No family history of thyroid cancer. - Around 2013, she was found to have cold  nodule on her right thyroid which reportedly was biopsied and showed benign findings.  - Her repeat thyroid ultrasound shows 4.4 cm nodule in the general area of the previously biopsied nodule on the right lobe of her thyroid.   - She has history of osteoporosis on prolia treatment, currently after 2 doses.  Review of Systems  Constitutional: + weight loss, no fatigue, + subjective hyperthermia, no subjective hypothermia Eyes: no blurry vision, no xerophthalmia ENT: no sore throat, + nodules palpated  in her thyroid,  no dysphagia/odynophagia, no hoarseness Cardiovascular: no Chest Pain, no Shortness of Breath, no palpitations, no leg swelling Respiratory: no cough, no SOB Gastrointestinal: no Nausea/Vomiting/Diarhhea Musculoskeletal: no muscle/joint aches Skin:  no rashes Neurological: + tremors ( did not notice until physical exam), no numbness, no tingling, no dizziness Psychiatric: no depression, no anxiety  Objective:    BP 138/69   Pulse 84   Ht '4\' 11"'  (1.499 m)   Wt 133 lb (60.3 kg)   BMI 26.86 kg/m   Wt Readings from Last 3 Encounters:  10/17/17 133 lb (60.3 kg)  10/03/17 135 lb (61.2 kg)  02/15/16 130 lb (59 kg)    Physical Exam  Constitutional: +  slightly overweight for height,  not in acute distress, normal state of mind Eyes: PERRLA, EOMI, no exophthalmos ENT: moist mucous membranes, +  palpable asymmetric thyromegaly, right lobe larger than left lobe,  no cervical lymphadenopathy Cardiovascular: normal precordial activity, Regular Rate and Rhythm, no Murmur/Rubs/Gallops Respiratory:  adequate breathing efforts, no gross chest deformity, Clear to auscultation bilaterally Gastrointestinal: abdomen soft, Non -tender, No distension, Bowel Sounds present Musculoskeletal: no gross deformities, strength intact in all four extremities Skin: moist, warm, no rashes Neurological: ++ tremor with outstretched hands, Deep tendon reflexes normal in all four extremities.  CMP ( most recent) CMP     Component Value Date/Time   NA 138 07/26/2007 0931   K 3.8 07/26/2007 0931   CL 106 07/26/2007 0931   CO2 25 07/26/2007 0931   GLUCOSE 92 07/26/2007 0931   BUN 13 07/26/2007 0931   CREATININE 0.61 07/26/2007 0931   CALCIUM 8.6 07/26/2007 0931   GFRNONAA >60 07/26/2007 0931   GFRAA  07/26/2007 0931    >60        The eGFR has been calculated using the MDRD equation. This calculation has not been validated in all clinical   Review of her 2013 thyroid uptake and scan revealed a large cold nodule which was seen 2 arise from the inferior pole of the right lobe of the thyroid-reportedly biopsied with benign outcomes. Cytology reports are not available to review.   Lab Results  Component Value Date   TSH 0.01 (L) 10/03/2017   TSH 0.01  (A) 05/16/2017   TSH 1.54 11/26/2014   FREET4 2.3 (H) 10/03/2017   FREET4 0.73 11/26/2014     Assessment & Plan:   1. Thyroid nodule 2. Subclinical hyperthyroidism   - She has history of nodular goiter ( historically biopsied with benign findings on the right lobe, still 4.4 cm), currently with  Hyperthyroidism. -  She has significant thyroid hormone burden. She will likely require ablative therapy with I-131 therapy. In preparation for that, she would need thyroid uptake and scan. - If she continues to have cold nodule, she will be considered for repeat biopsy before I-131 therapy.  She will return in  10 days to discuss results and make treatment decisions if necessary. - I did not initiate any new prescriptions  today. - She wishes to follow up with her PCP for treatment of osteoporosis currently on prolia a status post 2 doses.  - I advised patient to maintain close follow up with Renee Rival, NP for primary care needs.  Follow up plan: Return in about 10 days (around 10/27/2017) for follow up with thyroid uptake and scan.   Glade Lloyd, MD [facility] [address] [place], [place] [address] Phone: [phone]  Fax: [phone]     10/17/2017, 5:56 PM  This note was partially dictated with voice recognition software. Similar sounding words can be transcribed inadequately or may not  be corrected upon review.

## 2017-10-25 ENCOUNTER — Ambulatory Visit (HOSPITAL_COMMUNITY)
Admission: RE | Admit: 2017-10-25 | Discharge: 2017-10-25 | Disposition: A | Discharge status: Home / Self Care | Payer: Medicare Other | Source: Ambulatory Visit | Attending: "Endocrinology | Admitting: "Endocrinology

## 2017-10-25 ENCOUNTER — Encounter (HOSPITAL_COMMUNITY): Payer: Self-pay

## 2017-10-25 DIAGNOSIS — E0590 Thyrotoxicosis, unspecified without thyrotoxic crisis or storm: Secondary | ICD-10-CM | POA: Insufficient documentation

## 2017-10-25 DIAGNOSIS — E079 Disorder of thyroid, unspecified: Secondary | ICD-10-CM | POA: Insufficient documentation

## 2017-10-25 MED ORDER — SODIUM IODIDE I-123 7.4 MBQ CAPS
400.0000 | ORAL_CAPSULE | Freq: Once | ORAL | Status: AC
  Administered 2017-10-25: 409 via ORAL

## 2017-10-26 ENCOUNTER — Ambulatory Visit (HOSPITAL_COMMUNITY)
Admission: RE | Admit: 2017-10-26 | Discharge: 2017-10-26 | Disposition: A | Discharge status: Home / Self Care | Payer: Medicare Other | Source: Ambulatory Visit | Attending: "Endocrinology | Admitting: "Endocrinology

## 2017-10-30 ENCOUNTER — Ambulatory Visit (INDEPENDENT_AMBULATORY_CARE_PROVIDER_SITE_OTHER): Payer: Medicare Other | Admitting: "Endocrinology

## 2017-10-30 ENCOUNTER — Encounter: Payer: Self-pay | Admitting: "Endocrinology

## 2017-10-30 VITALS — BP 131/79 | HR 90 | Ht 59.0 in | Wt 133.0 lb

## 2017-10-30 DIAGNOSIS — E041 Nontoxic single thyroid nodule: Secondary | ICD-10-CM | POA: Diagnosis not present

## 2017-10-30 DIAGNOSIS — E0590 Thyrotoxicosis, unspecified without thyrotoxic crisis or storm: Secondary | ICD-10-CM

## 2017-10-30 NOTE — Progress Notes (Signed)
Consult Note                                            10/30/2017, 3:17 PM   Subjective:    Patient ID: Jody Howe, female    DOB: Jun 15, 1946, PCP Renee Rival, NP   Past Medical History:  Diagnosis Date  . Allergic rhinitis   . Arthritis   . Constipation   . Cystocele    mild on exam  . Goiter   . Medical history non-contributory   . Osteoporosis   . Plantar fasciitis    Past Surgical History:  Procedure Laterality Date  . BASAL CELL CARCINOMA EXCISION    . CATARACT EXTRACTION    . COLONOSCOPY WITH PROPOFOL N/A 02/15/2016   Procedure: COLONOSCOPY WITH PROPOFOL;  Surgeon: Hulen Luster, MD;  Location: [facility] ENDOSCOPY;  Service: Gastroenterology;  Laterality: N/A;  . NOSE SURGERY     excision and flap - [facility] left nose  . RETINAL LASER PROCEDURE  10/08   laster retinal surgery  . TONSILLECTOMY     Social History   Socioeconomic History  . Marital status: Married    Spouse name: None  . Number of children: None  . Years of education: None  . Highest education level: None  Social Needs  . Financial resource strain: None  . Food insecurity - worry: None  . Food insecurity - inability: None  . Transportation needs - medical: None  . Transportation needs - non-medical: None  Occupational History  . None  Tobacco Use  . Smoking status: Never Smoker  . Smokeless tobacco: Never Used  Substance and Sexual Activity  . Alcohol use: No  . Drug use: No  . Sexual activity: None  Other Topics Concern  . None  Social History Narrative  . None   Outpatient Encounter Medications as of 10/30/2017  Medication Sig  . calcium citrate-vitamin D (CITRACAL+D) 315-200 MG-UNIT per tablet Take 2 tablets by mouth 2 (two) times daily.  . cetirizine (ZYRTEC) 10 MG tablet Take 10 mg by mouth daily.  Marland Kitchen denosumab (PROLIA) 60 MG/ML SOLN injection Inject 60 mg into the skin every 6 (six) months. Administer in upper arm, thigh, or abdomen  . fluticasone (FLONASE) 50 MCG/ACT  nasal spray Place into both nostrils daily.  Marland Kitchen omeprazole (PRILOSEC) 20 MG capsule Take 20 mg by mouth daily.   No facility-administered encounter medications on file as of 10/30/2017.    ALLERGIES: Allergies  Allergen Reactions  . Ceftin [Cefuroxime Axetil] Other (See Comments)  . Other Hives  . Sulfa Antibiotics Hives    VACCINATION STATUS: Immunization History  Administered Date(s) Administered  . Influenza-Unspecified 07/09/2014    HPI Jody Howe is 72 y.o. female who presents today with a medical history as above. -Her thyroid uptake and scan is consistent with hyperthyroidism with 43% uptake in 24 hours, with a dominant cold mass on the right lobe of the thyroid. - She recently had thyroid function tests consistent with hyperthyroidism.   -She has no new complaints, had a steady state. she denies family history of thyroid dysfunction. No family history of thyroid cancer. - Around 2013, she was found to have cold nodule on her right thyroid which reportedly was biopsied and showed benign findings.  - Her repeat thyroid ultrasound shows 4.4 cm nodule in the general area  of the previously biopsied nodule on the right lobe of her thyroid.   - She has history of osteoporosis on prolia treatment, currently after 2 doses. -Her most recent thyroid ultrasound showed 4.4 cm thyroid nodule.  Review of Systems  Constitutional: + recent  weight loss, no fatigue, + subjective hyperthermia, no subjective hypothermia Eyes: no blurry vision, no xerophthalmia ENT: no sore throat, + nodules palpated  in her thyroid,  no dysphagia/odynophagia, no hoarseness Cardiovascular: no Chest Pain, no Shortness of Breath, no palpitations, no leg swelling Respiratory: no cough, no SOB Gastrointestinal: no Nausea/Vomiting/Diarhhea Musculoskeletal: no muscle/joint aches Skin: no rashes Neurological: + tremors ( did not notice until physical exam), no numbness, no tingling, no dizziness Psychiatric:  no depression, no anxiety  Objective:    BP 131/79   Pulse 90   Ht _0  (1.499 m)   Wt 133 lb (60.3 kg)   BMI 26.86 kg/m   Wt Readings from Last 3 Encounters:  10/30/17 133 lb (60.3 kg)  10/17/17 133 lb (60.3 kg)  10/03/17 135 lb (61.2 kg)    Physical Exam  Constitutional: +  slightly overweight for height,  not in acute distress, normal state of mind Eyes: PERRLA, EOMI, no exophthalmos ENT: moist mucous membranes, +  palpable asymmetric thyromegaly, right lobe larger than left lobe,  no cervical lymphadenopathy Cardiovascular: normal precordial activity, Regular Rate and Rhythm, no Murmur/Rubs/Gallops Respiratory:  adequate breathing efforts, no gross chest deformity, Clear to auscultation bilaterally Gastrointestinal: abdomen soft, Non -tender, No distension, Bowel Sounds present Musculoskeletal: no gross deformities, strength intact in all four extremities Skin: moist, warm, no rashes Neurological: ++ tremor with outstretched hands, Deep tendon reflexes normal in all four extremities.  CMP ( most recent) CMP     Component Value Date/Time   NA 138 07/26/2007 0931   K 3.8 07/26/2007 0931   CL 106 07/26/2007 0931   CO2 25 07/26/2007 0931   GLUCOSE 92 07/26/2007 0931   BUN 13 07/26/2007 0931   CREATININE 0.61 07/26/2007 0931   CALCIUM 8.6 07/26/2007 0931   GFRNONAA >60 07/26/2007 0931   GFRAA  07/26/2007 0931    >60        The eGFR has been calculated using the MDRD equation. This calculation has not been validated in all clinical   Review of her 2013 thyroid uptake and scan revealed a large cold nodule which was seen to arise from the inferior pole of the right lobe of the thyroid-reportedly biopsied with benign outcomes.  Cytology reports show that they were benign with some Hurthle cells.  Thyroid ultrasound from October 03, 2016 showed 4.4 cm right inferior pole solid thyroid nodule on the right lobe of the thyroid.  -This nodule appeared as a cold mass on the  thyroid uptake and scan on October 26, 2017 which showed 43% uptake in 24 hours consistent with hyperthyroidism.   Lab Results  Component Value Date   TSH 0.01 (L) 10/03/2017   TSH 0.01 (A) 05/16/2017   TSH 1.54 11/26/2014   FREET4 2.3 (H) 10/03/2017   FREET4 0.73 11/26/2014     Assessment & Plan:   1. Cold Nodule on right Lobe of Thyroid  2. Hyperthyroidism  - She has history of nodular goiter ( historically biopsied with benign findings on the right lobe, still 4.4 cm), currently with  Hyperthyroidism. -  She has significant thyroid hormone burden confirmed with thyroid uptake and scan at 43%. She will likely require ablative therapy with I-131 therapy. -Before  administering definitive I-131 therapy, malignancy needs to be ruled out.  She is approached for repeat biopsy of this dominant cold mass on the right lobe, she agrees with plan.   -This will be scheduled to be done as soon as possible and patient will return in 2 weeks with results.   -If biopsy shows suspicious findings, she will be offered total thyroidectomy.  -If her fine-needle aspiration is benign, she will be considered for I-131 thyroid ablation, she understands the subsequent need for thyroid hormone replacement for life.  - She wishes to follow up with her PCP for treatment of osteoporosis currently on prolia a status post 2 doses.  - I advised patient to maintain close follow up with Renee Rival, NP for primary care needs.  Follow up plan: Return for follow up with biopsy results.   Glade Lloyd, MD [facility] [address] [place], [place] [address] Phone: [phone]  Fax: [phone]     10/30/2017, 3:17 PM  This note was partially dictated with voice recognition software. Similar sounding words can be transcribed inadequately or may not  be corrected upon review.

## 2017-11-02 ENCOUNTER — Telehealth: Payer: Self-pay

## 2017-11-02 NOTE — Telephone Encounter (Signed)
Central scheduling to call pt and set up FNA

## 2017-11-08 ENCOUNTER — Encounter (HOSPITAL_COMMUNITY): Payer: Self-pay

## 2017-11-08 ENCOUNTER — Ambulatory Visit (HOSPITAL_COMMUNITY)
Admission: RE | Admit: 2017-11-08 | Discharge: 2017-11-08 | Disposition: A | Discharge status: Home / Self Care | Payer: Medicare Other | Source: Ambulatory Visit | Attending: "Endocrinology | Admitting: "Endocrinology

## 2017-11-08 DIAGNOSIS — E041 Nontoxic single thyroid nodule: Secondary | ICD-10-CM

## 2017-11-08 MED ORDER — LIDOCAINE HCL (PF) 2 % IJ SOLN
INTRAMUSCULAR | Status: AC
  Filled 2017-11-08: qty 20

## 2017-11-08 NOTE — Discharge Instructions (Signed)
Thyroid Biopsy °The thyroid gland is a butterfly-shaped gland located in the front of the neck. It produces hormones that affect metabolism, growth and development, and body temperature. Thyroid biopsy is a procedure in which small samples of tissue or fluid are removed from the thyroid gland. The samples are then looked at under a microscope to check for abnormalities. This procedure is done to determine the cause of thyroid problems. It may be done to check for infection, cancer, or other thyroid problems. °Two methods may be used for a thyroid biopsy. In one method, a thin needle is inserted through the skin and into the thyroid gland. In the other method, an open incision is made through the skin. °Tell a health care provider about: °· Any allergies you have. °· All medicines you are taking, including vitamins, herbs, eye drops, creams, and over-the-counter medicines. °· Any problems you or family members have had with anesthetic medicines. °· Any blood disorders you have. °· Any surgeries you have had. °· Any medical conditions you have. °What are the risks? °Generally, this is a safe procedure. However, problems can occur and include: °· Bleeding from the procedure site. °· Infection. °· Injury to structures near the thyroid gland. ° °What happens before the procedure? °· Ask your health care provider about: °? Changing or stopping your regular medicines. This is especially important if you are taking diabetes medicines or blood thinners. °? Taking medicines such as aspirin and ibuprofen. These medicines can thin your blood. Do not take these medicines before your procedure if your health care provider asks you not to. °· Do not eat or drink anything after midnight on the night before the procedure or as directed by your health care provider. °· You may have a blood sample taken. °What happens during the procedure? °Either of these methods may be used to perform a thyroid biopsy: °· Fine needle biopsy. You may  be given medicine to help you relax (sedative). You will be asked to lie on your back with your head tipped backward to extend your neck. An area on your neck will be cleaned. A needle will then be inserted through the skin of your neck. You may be asked to avoid coughing, talking, swallowing, or making sounds during some portions of the procedure. The needle will be withdrawn once the tissue or fluid samples have been removed. Pressure may be applied to your neck to reduce swelling and ensure that bleeding has stopped. The samples will be sent to a lab for examination. °· Open biopsy. You will be given medicine to make you sleep (general anesthetic). An incision will be made in your neck. A sample of thyroid tissue will be removed using surgical tools. The tissue sample will be sent for examination. In some cases, the sample may be examined during the biopsy. If that is done and cancer cells are found, some or all of the thyroid gland may be removed. The incision will be closed with stitches. ° °What happens after the procedure? °· Your recovery will be assessed and monitored. °· You may have soreness and tenderness at the site of the biopsy. This should go away after a few days. °· If you had an open biopsy, you may have a hoarse voice or sore throat for a couple days. °· It is your responsibility to get your test results. °This information is not intended to replace advice given to you by your health care provider. Make sure you discuss any questions you have with   your health care provider. °Document Released: 07/10/2007 Document Revised: 05/15/2016 Document Reviewed: 12/05/2013 °Elsevier Interactive Patient Education © 2018 [employer] Inc[employer] ° °

## 2017-11-13 ENCOUNTER — Encounter: Payer: Self-pay | Admitting: "Endocrinology

## 2017-11-13 ENCOUNTER — Ambulatory Visit (INDEPENDENT_AMBULATORY_CARE_PROVIDER_SITE_OTHER): Payer: Medicare Other | Admitting: "Endocrinology

## 2017-11-13 VITALS — BP 121/76 | HR 80 | Ht 59.0 in | Wt 134.0 lb

## 2017-11-13 DIAGNOSIS — E0590 Thyrotoxicosis, unspecified without thyrotoxic crisis or storm: Secondary | ICD-10-CM

## 2017-11-13 NOTE — Progress Notes (Signed)
Consult Note                                            11/13/2017, 3:11 PM   Subjective:    Patient ID: Jody Howe, female    DOB: December 10, 1945, PCP Barry Dienes, NP   Past Medical History:  Diagnosis Date  . Allergic rhinitis   . Arthritis   . Constipation   . Cystocele    mild on exam  . Goiter   . Medical history non-contributory   . Osteoporosis   . Plantar fasciitis    Past Surgical History:  Procedure Laterality Date  . BASAL CELL CARCINOMA EXCISION    . CATARACT EXTRACTION    . COLONOSCOPY WITH PROPOFOL N/A 02/15/2016   Procedure: COLONOSCOPY WITH PROPOFOL;  Surgeon: Hulen Luster, MD;  Location: [facility] ENDOSCOPY;  Service: Gastroenterology;  Laterality: N/A;  . NOSE SURGERY     excision and flap - [facility] left nose  . RETINAL LASER PROCEDURE  10/08   laster retinal surgery  . TONSILLECTOMY     Social History   Socioeconomic History  . Marital status: Married    Spouse name: None  . Number of children: None  . Years of education: None  . Highest education level: None  Social Needs  . Financial resource strain: None  . Food insecurity - worry: None  . Food insecurity - inability: None  . Transportation needs - medical: None  . Transportation needs - non-medical: None  Occupational History  . None  Tobacco Use  . Smoking status: Never Smoker  . Smokeless tobacco: Never Used  Substance and Sexual Activity  . Alcohol use: No  . Drug use: No  . Sexual activity: None  Other Topics Concern  . None  Social History Narrative  . None   Outpatient Encounter Medications as of 11/13/2017  Medication Sig  . calcium citrate-vitamin D (CITRACAL+D) 315-200 MG-UNIT per tablet Take 2 tablets by mouth 2 (two) times daily.  Marland Kitchen denosumab (PROLIA) 60 MG/ML SOLN injection Inject 60 mg into the skin every 6 (six) months. Administer in upper arm, thigh, or abdomen  . [DISCONTINUED] cetirizine (ZYRTEC) 10 MG tablet Take 10 mg by mouth daily.  . [DISCONTINUED]  fluticasone (FLONASE) 50 MCG/ACT nasal spray Place into both nostrils daily.  . [DISCONTINUED] omeprazole (PRILOSEC) 20 MG capsule Take 20 mg by mouth daily.   No facility-administered encounter medications on file as of 11/13/2017.    ALLERGIES: Allergies  Allergen Reactions  . Ceftin [Cefuroxime Axetil] Other (See Comments)  . Other Hives  . Sulfa Antibiotics Hives    VACCINATION STATUS: Immunization History  Administered Date(s) Administered  . Influenza-Unspecified 07/09/2014    HPI Jody Howe is 72 y.o. female who presents today with a medical history as above. -Her thyroid uptake and scan is consistent with hyperthyroidism with 43% uptake in 24 hours, with a dominant cold mass on the right lobe of the thyroid. - She recently had thyroid function tests consistent with hyperthyroidism.   -She has no new complaints, had a steady state. she denies family history of thyroid dysfunction. No family history of thyroid cancer. - Around 2013, she was found to have cold nodule on her right thyroid which reportedly was biopsied and showed benign findings.  - Her repeat thyroid ultrasound shows 4.4 cm nodule in the  general area of the previously biopsied nodule on the right lobe of her thyroid.  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  - She has history of osteoporosis on prolia treatment, currently after 2 doses. -Her most recent thyroid ultrasound showed 4.4 cm thyroid nodule.  Review of Systems  Constitutional: + recent  weight loss, no fatigue, + subjective hyperthermia, no subjective hypothermia Eyes: no blurry vision, no xerophthalmia ENT: no sore throat, + nodules palpated  in her thyroid,  no dysphagia/odynophagia, no hoarseness Cardiovascular: no Chest Pain, no Shortness of Breath, no palpitations, no leg swelling Respiratory: no cough, no SOB Gastrointestinal: no Nausea/Vomiting/Diarhhea Musculoskeletal: no muscle/joint  aches Skin: no rashes Neurological: + tremors (patient did not notice until the time of physical exam), no numbness, no tingling, no dizziness Psychiatric: no depression, no anxiety  Objective:    BP 121/76   Pulse 80   Ht _0  (1.499 m)   Wt 134 lb (60.8 kg)   BMI 27.06 kg/m   Wt Readings from Last 3 Encounters:  11/13/17 134 lb (60.8 kg)  10/30/17 133 lb (60.3 kg)  10/17/17 133 lb (60.3 kg)    Physical Exam  Constitutional: +  slightly overweight for height,  not in acute distress, normal state of mind Eyes: PERRLA, EOMI, no exophthalmos ENT: moist mucous membranes, +  palpable asymmetric thyromegaly, right lobe larger than left lobe,  no cervical lymphadenopathy Cardiovascular: normal precordial activity, Regular Rate and Rhythm, no Murmur/Rubs/Gallops Respiratory:  adequate breathing efforts, no gross chest deformity, Clear to auscultation bilaterally Gastrointestinal: abdomen soft, Non -tender, No distension, Bowel Sounds present Musculoskeletal: no gross deformities, strength intact in all four extremities Skin: moist, warm, no rashes Neurological: ++ tremor with outstretched hands, Deep tendon reflexes normal in all four extremities.  CMP ( most recent) CMP     Component Value Date/Time   NA 138 07/26/2007 0931   K 3.8 07/26/2007 0931   CL 106 07/26/2007 0931   CO2 25 07/26/2007 0931   GLUCOSE 92 07/26/2007 0931   BUN 13 07/26/2007 0931   CREATININE 0.61 07/26/2007 0931   CALCIUM 8.6 07/26/2007 0931   GFRNONAA >60 07/26/2007 0931   GFRAA  07/26/2007 0931    >60        The eGFR has been calculated using the MDRD equation. This calculation has not been validated in all clinical   Review of her 2013 thyroid uptake and scan revealed a large cold nodule which was seen to arise from the inferior pole of the right lobe of the thyroid-reportedly biopsied with benign outcomes.  Cytology reports show that they were benign with some Hurthle cells.  Thyroid  ultrasound from October 03, 2016 showed 4.4 cm right inferior pole solid thyroid nodule on the right lobe of the thyroid.  -This nodule appeared as a cold mass on the thyroid uptake and scan on October 26, 2017 which showed 43% uptake in 24 hours consistent with hyperthyroidism.  Fine-needle aspiration of dominant right-sided nodule of the thyroid on November 08, 2017 Diagnosis THYROID, FINE NEEDLE ASPIRATION, RIGHT (SPECIMEN 1 OF 1 COLLECTED 11/08/2017 CONSISTENT WITH BENIGN FOLLICULAR NODULE (BETHESDA CATEGORY II  Lab Results  Component Value Date   TSH 0.01 (L) 10/03/2017   TSH 0.01 (A) 05/16/2017   TSH 1.54 11/26/2014   FREET4 2.3 (H) 10/03/2017   FREET4 0.73 11/26/2014     Assessment & Plan:   1. Cold Nodule on right Lobe of Thyroid  2. Hyperthyroidism  -  She has history of nodular goiter ( historically biopsied with benign findings on the right lobe, still 4.4 cm), currently with  Hyperthyroidism. -Repeat fine-needle aspiration of dominant right-sided nodule is negative for malignancy. -  She has significant thyroid hormone burden confirmed with thyroid uptake and scan at 43%. She will  require ablative therapy with I-131 therapy. -I discussed and ordered ablative therapy with I-131.  Patient is aware of the fact that she would require thyroid hormone replacement subsequent to ablative therapy.  - She wishes to follow up with her PCP for treatment of osteoporosis currently on prolia a status post 2 doses. -She will return in 8 weeks with thyroid function test, TSH/free T4 for reevaluation. - I advised patient to maintain close follow up with Barry Dienes, NP for primary care needs.  Follow up plan: Return in about 8 weeks (around 01/08/2018) for follow up with labs after I131 therapy.   Glade Lloyd, MD [facility] [address] [place], [place] [address] Phone: [phone]  Fax: [phone]     11/13/2017, 3:11  PM  This note was partially dictated with voice recognition software. Similar sounding words can be transcribed inadequately or may not  be corrected upon review.

## 2017-11-16 ENCOUNTER — Other Ambulatory Visit (HOSPITAL_COMMUNITY): Payer: Self-pay | Admitting: Nurse Practitioner

## 2017-11-16 DIAGNOSIS — Z1231 Encounter for screening mammogram for malignant neoplasm of breast: Secondary | ICD-10-CM

## 2017-11-23 ENCOUNTER — Encounter (HOSPITAL_COMMUNITY)
Admission: RE | Admit: 2017-11-23 | Discharge: 2017-11-23 | Disposition: A | Discharge status: Home / Self Care | Payer: Medicare Other | Source: Ambulatory Visit | Attending: "Endocrinology | Admitting: "Endocrinology

## 2017-11-23 ENCOUNTER — Encounter (HOSPITAL_COMMUNITY): Payer: Self-pay

## 2017-11-23 DIAGNOSIS — E0590 Thyrotoxicosis, unspecified without thyrotoxic crisis or storm: Secondary | ICD-10-CM | POA: Insufficient documentation

## 2017-11-23 MED ORDER — SODIUM IODIDE I 131 CAPSULE
25.0000 | Freq: Once | INTRAVENOUS | Status: AC | PRN
  Administered 2017-11-23: 25.6 via ORAL

## 2017-11-27 ENCOUNTER — Ambulatory Visit (HOSPITAL_COMMUNITY): Payer: Medicare Other

## 2017-12-11 ENCOUNTER — Ambulatory Visit (HOSPITAL_COMMUNITY)
Admission: RE | Admit: 2017-12-11 | Discharge: 2017-12-11 | Disposition: A | Discharge status: Home / Self Care | Payer: Medicare Other | Source: Ambulatory Visit | Attending: Nurse Practitioner | Admitting: Nurse Practitioner

## 2017-12-11 DIAGNOSIS — Z1231 Encounter for screening mammogram for malignant neoplasm of breast: Secondary | ICD-10-CM | POA: Diagnosis present

## 2018-01-04 LAB — T4, FREE: Free T4: 4.7 ng/dL — ABNORMAL HIGH (ref 0.8–1.8)

## 2018-01-04 LAB — TSH

## 2018-01-05 ENCOUNTER — Other Ambulatory Visit: Payer: Self-pay | Admitting: "Endocrinology

## 2018-01-05 ENCOUNTER — Telehealth: Payer: Self-pay

## 2018-01-05 MED ORDER — PREDNISONE 5 MG PO TABS
5.0000 mg | ORAL_TABLET | Freq: Every day | ORAL | 0 refills | Status: DC
Start: 2018-01-05 — End: 2018-01-11

## 2018-01-05 MED ORDER — PROPRANOLOL HCL 20 MG PO TABS: 20.0000 mg | ORAL_TABLET | Freq: Two times a day (BID) | ORAL | 2 refills | Status: DC

## 2018-01-05 NOTE — Telephone Encounter (Signed)
-----   Message from Cassandria Anger, MD sent at 01/05/2018  7:59 AM EDT ----- Maudie Mercury, I want to send a prescription for Propranolol and prednisone to her pharmacy. She is having rebound thyrotoxicosis. She can start these meds as soon she gets them.

## 2018-01-05 NOTE — Telephone Encounter (Signed)
Left message for pt to call back  °

## 2018-01-08 ENCOUNTER — Telehealth: Payer: Self-pay

## 2018-01-08 NOTE — Telephone Encounter (Signed)
-----   Message from Cassandria Anger, MD sent at 01/05/2018  7:59 AM EDT ----- Maudie Mercury, I want to send a prescription for Propranolol and prednisone to her pharmacy. She is having rebound thyrotoxicosis. She can start these meds as soon she gets them.

## 2018-01-08 NOTE — Telephone Encounter (Signed)
Left message for pt to call back  °

## 2018-01-11 ENCOUNTER — Ambulatory Visit (INDEPENDENT_AMBULATORY_CARE_PROVIDER_SITE_OTHER): Payer: Medicare Other | Admitting: "Endocrinology

## 2018-01-11 ENCOUNTER — Encounter: Payer: Self-pay | Admitting: "Endocrinology

## 2018-01-11 VITALS — BP 119/73 | HR 84 | Ht 59.0 in | Wt 133.0 lb

## 2018-01-11 DIAGNOSIS — E0590 Thyrotoxicosis, unspecified without thyrotoxic crisis or storm: Secondary | ICD-10-CM | POA: Diagnosis not present

## 2018-01-11 MED ORDER — PREDNISONE 5 MG PO TABS: 5.0000 mg | ORAL_TABLET | Freq: Every day | ORAL | 0 refills | Status: DC

## 2018-01-11 NOTE — Progress Notes (Signed)
Endocrinology follow-up note                                             01/11/2018, 1:33 PM   Subjective:    Patient ID: [name], female    DOB: [birth year], PCP Barry Dienes, NP   Past Medical History:  Diagnosis Date  . Allergic rhinitis   . Arthritis   . Constipation   . Cystocele    mild on exam  . Goiter   . Medical history non-contributory   . Osteoporosis   . Plantar fasciitis    Past Surgical History:  Procedure Laterality Date  . BASAL CELL CARCINOMA EXCISION    . CATARACT EXTRACTION    . COLONOSCOPY WITH PROPOFOL N/A 02/15/2016   Procedure: COLONOSCOPY WITH PROPOFOL;  Surgeon: Hulen Luster, MD;  Location: [facility] ENDOSCOPY;  Service: Gastroenterology;  Laterality: N/A;  . NOSE SURGERY     excision and flap - [facility] left nose  . RETINAL LASER PROCEDURE  10/08   laster retinal surgery  . TONSILLECTOMY     Social History   Socioeconomic History  . Marital status: Married    Spouse name: Not on file  . Number of children: Not on file  . Years of education: Not on file  . Highest education level: Not on file  Occupational History  . Not on file  Social Needs  . Financial resource strain: Not on file  . Food insecurity:    Worry: Not on file    Inability: Not on file  . Transportation needs:    Medical: Not on file    Non-medical: Not on file  Tobacco Use  . Smoking status: Never Smoker  . Smokeless tobacco: Never Used  Substance and Sexual Activity  . Alcohol use: No  . Drug use: No  . Sexual activity: Not on file  Lifestyle  . Physical activity:    Days per week: Not on file    Minutes per session: Not on file  . Stress: Not on file  Relationships  . Social connections:    Talks on phone: Not on file    Gets together: Not on file    Attends religious service: Not on file    Active member of club or organization: Not on file    Attends meetings of clubs or organizations: Not on file    Relationship status: Not on file  Other Topics  Concern  . Not on file  Social History Narrative  . Not on file   Outpatient Encounter Medications as of 01/11/2018  Medication Sig  . calcium citrate-vitamin D (CITRACAL+D) 315-200 MG-UNIT per tablet Take 2 tablets by mouth 2 (two) times daily.  Marland Kitchen denosumab (PROLIA) 60 MG/ML SOLN injection Inject 60 mg into the skin every 6 (six) months. Administer in upper arm, thigh, or abdomen  . predniSONE (DELTASONE) 5 MG tablet Take 1 tablet (5 mg total) by mouth daily with breakfast.  . propranolol (INDERAL) 20 MG tablet Take 1 tablet (20 mg total) by mouth 2 (two) times daily.  . [DISCONTINUED] predniSONE (DELTASONE) 5 MG tablet Take 1 tablet (5 mg total) by mouth daily with breakfast.   No facility-administered encounter medications on file as of 01/11/2018.    ALLERGIES: Allergies  Allergen Reactions  . Ceftin [Cefuroxime Axetil] Other (See Comments)  . Other  Hives  . Sulfa Antibiotics Hives    VACCINATION STATUS: Immunization History  Administered Date(s) Administered  . Influenza-Unspecified 07/09/2014    HPI Jody Howe is 72 y.o. female who presents today with a medical history as above. -She is status post therapeutic radioactive iodine on November 23, 2017 for hyperthyroidism confirmed on thyroid uptake and scan with 24-hour uptake of 43%.  - A dominant cold mass on the right lobe of the thyroid was biopsied and negative for malignancy. -She has symptoms including anxiety, improving palpitations, and heat intolerance.  Her previsit labs were consistent with rebound thyrotoxicosis.  she denies family history of thyroid dysfunction. No family history of thyroid cancer. - Around 2013, she was found to have cold nodule on her right thyroid which reportedly was biopsied and showed benign findings.  - Her repeat thyroid ultrasound shows 4.4 cm nodule in the general area of the previously biopsied nodule on the right lobe of her thyroid.  Fine-needle aspiration of these nodules was  consistent with benign follicular adenoma.  - She has history of osteoporosis on prolia treatment, currently after 2 doses. -Her most recent thyroid ultrasound showed 4.4 cm thyroid nodule.  Review of Systems  Constitutional: +   weight loss prior to her therapy with radioactive iodine, + fatigue, + subjective hyperthermia, no subjective hypothermia Eyes: no blurry vision, no xerophthalmia ENT: no sore throat, + nodules palpated  in her thyroid,  no dysphagia/odynophagia, no hoarseness Cardiovascular: no Chest Pain, no Shortness of Breath, no palpitations, no leg swelling Respiratory: no cough, no SOB Gastrointestinal: no Nausea/Vomiting/Diarhhea Musculoskeletal: no muscle/joint aches Skin: no rashes Neurological: + tremors (improving).  Psychiatric: no depression, no anxiety  Objective:    BP 119/73   Pulse 84   Ht '4\' 11"'  (1.499 m)   Wt 133 lb (60.3 kg)   BMI 26.86 kg/m   Wt Readings from Last 3 Encounters:  01/11/18 133 lb (60.3 kg)  11/13/17 134 lb (60.8 kg)  10/30/17 133 lb (60.3 kg)    Physical Exam  Constitutional: +  slightly overweight for height,  not in acute distress, + anxious state of mind Eyes: PERRLA, EOMI, no exophthalmos ENT: moist mucous membranes, +  palpable asymmetric thyromegaly, right lobe larger than left lobe,  no cervical lymphadenopathy Cardiovascular: normal precordial activity, Regular Rate and Rhythm, no Murmur/Rubs/Gallops Respiratory:  adequate breathing efforts.  Musculoskeletal: no gross deformities, strength intact in all four extremities Skin: moist, warm, no rashes Neurological: + tremor with outstretched hands   CMP     Component Value Date/Time   NA 138 07/26/2007 0931   K 3.8 07/26/2007 0931   CL 106 07/26/2007 0931   CO2 25 07/26/2007 0931   GLUCOSE 92 07/26/2007 0931   BUN 13 07/26/2007 0931   CREATININE 0.61 07/26/2007 0931   CALCIUM 8.6 07/26/2007 0931   GFRNONAA >60 07/26/2007 0931   GFRAA  07/26/2007 0931    >60         The eGFR has been calculated using the MDRD equation. This calculation has not been validated in all clinical   Review of her 2013 thyroid uptake and scan revealed a large cold nodule which was seen to arise from the inferior pole of the right lobe of the thyroid-reportedly biopsied with benign outcomes.  Cytology reports show that they were benign with some Hurthle cells.  Thyroid ultrasound from October 03, 2016 showed 4.4 cm right inferior pole solid thyroid nodule on the right lobe of the thyroid.  -This  nodule appeared as a cold mass on the thyroid uptake and scan on October 26, 2017 which showed 43% uptake in 24 hours consistent with hyperthyroidism.  Fine-needle aspiration of dominant right-sided nodule of the thyroid on November 08, 2017 Diagnosis THYROID, FINE NEEDLE ASPIRATION, RIGHT (SPECIMEN 1 OF 1 COLLECTED 11/08/2017 CONSISTENT WITH BENIGN FOLLICULAR NODULE (BETHESDA CATEGORY II  Lab Results  Component Value Date   TSH <0.01 (L) 01/04/2018   TSH 0.01 (L) 10/03/2017   TSH 0.01 (A) 05/16/2017   TSH 1.54 11/26/2014   FREET4 4.7 (H) 01/04/2018   FREET4 2.3 (H) 10/03/2017   FREET4 0.73 11/26/2014     Assessment & Plan:   1. Cold Nodule on right Lobe of Thyroid -negative biopsy for malignancy 2. Hyperthyroidism -Her previsit labs are consistent with rebound thyrotoxicosis with clinical symptoms.  It is unlikely to be treatment failure, very likely to be late response. -I discussed and put her on low-dose prednisone 5 mg p.o. daily for the next [redacted] weeks along with propanolol 20 mg p.o. twice daily. -She is not ready for thyroid hormone replacement. Patient is aware of the fact that she would require thyroid hormone replacement subsequent to ablative therapy.  - She wishes to follow up with her PCP for treatment of osteoporosis currently on prolia a status post 2 doses. -She will return in 8 weeks with thyroid function test, TSH/free T4 for reevaluation. - I advised  patient to maintain close follow up with Barry Dienes, NP for primary care needs.  Follow up plan: Return in about 8 weeks (around 03/08/2018) for follow up with pre-visit labs.   Glade Lloyd, MD [facility] [address] [place], [place] [address] Phone: [phone]  Fax: [phone]     01/11/2018, 1:33 PM  This note was partially dictated with voice recognition software. Similar sounding words can be transcribed inadequately or may not  be corrected upon review.

## 2018-01-15 ENCOUNTER — Telehealth: Payer: Self-pay | Admitting: "Endocrinology

## 2018-01-15 NOTE — Telephone Encounter (Signed)
Went over questions Mrs Jody Howe had concerning how long to take the Prednisone. Pt notified to take until all 15 tabs are gone.

## 2018-01-15 NOTE — Telephone Encounter (Signed)
Janella is calling asking to speak to Dr. Emi Holes Nurse, please advise?

## 2018-03-03 LAB — T3, FREE: T3 FREE: 17 pg/mL — AB (ref 2.3–4.2)

## 2018-03-03 LAB — T4, FREE: FREE T4: 4 ng/dL — AB (ref 0.8–1.8)

## 2018-03-03 LAB — TSH

## 2018-03-09 ENCOUNTER — Ambulatory Visit: Payer: Medicare Other | Admitting: "Endocrinology

## 2018-03-09 ENCOUNTER — Encounter: Payer: Self-pay | Admitting: "Endocrinology

## 2018-03-09 ENCOUNTER — Other Ambulatory Visit: Payer: Self-pay | Admitting: "Endocrinology

## 2018-03-09 VITALS — BP 118/70 | HR 74 | Ht 59.0 in | Wt 120.0 lb

## 2018-03-09 DIAGNOSIS — E0590 Thyrotoxicosis, unspecified without thyrotoxic crisis or storm: Secondary | ICD-10-CM

## 2018-03-09 MED ORDER — PREDNISONE 10 MG PO TABS: 10.0000 mg | ORAL_TABLET | Freq: Every day | ORAL | 0 refills | Status: DC

## 2018-03-09 NOTE — Progress Notes (Signed)
Endocrinology follow-up note                                             03/09/2018, 1:05 PM   Subjective:    Patient ID: Jody Howe, female    DOB: [birth year], PCP Barry Dienes, NP   Past Medical History:  Diagnosis Date  . Allergic rhinitis   . Arthritis   . Constipation   . Cystocele    mild on exam  . Goiter   . Medical history non-contributory   . Osteoporosis   . Plantar fasciitis    Past Surgical History:  Procedure Laterality Date  . BASAL CELL CARCINOMA EXCISION    . CATARACT EXTRACTION    . COLONOSCOPY WITH PROPOFOL N/A 02/15/2016   Procedure: COLONOSCOPY WITH PROPOFOL;  Surgeon: Hulen Luster, MD;  Location: [facility] ENDOSCOPY;  Service: Gastroenterology;  Laterality: N/A;  . NOSE SURGERY     excision and flap - [facility] left nose  . RETINAL LASER PROCEDURE  10/08   laster retinal surgery  . TONSILLECTOMY     Social History   Socioeconomic History  . Marital status: Married    Spouse name: Not on file  . Number of children: Not on file  . Years of education: Not on file  . Highest education level: Not on file  Occupational History  . Not on file  Social Needs  . Financial resource strain: Not on file  . Food insecurity:    Worry: Not on file    Inability: Not on file  . Transportation needs:    Medical: Not on file    Non-medical: Not on file  Tobacco Use  . Smoking status: Never Smoker  . Smokeless tobacco: Never Used  Substance and Sexual Activity  . Alcohol use: No  . Drug use: No  . Sexual activity: Not on file  Lifestyle  . Physical activity:    Days per week: Not on file    Minutes per session: Not on file  . Stress: Not on file  Relationships  . Social connections:    Talks on phone: Not on file    Gets together: Not on file    Attends religious service: Not on file    Active member of club or organization: Not on file    Attends meetings of clubs or organizations: Not on file    Relationship status: Not on file  Other Topics  Concern  . Not on file  Social History Narrative  . Not on file   Outpatient Encounter Medications as of 03/09/2018  Medication Sig  . calcium citrate-vitamin D (CITRACAL+D) 315-200 MG-UNIT per tablet Take 2 tablets by mouth 2 (two) times daily.  Marland Kitchen denosumab (PROLIA) 60 MG/ML SOLN injection Inject 60 mg into the skin every 6 (six) months. Administer in upper arm, thigh, or abdomen  . predniSONE (DELTASONE) 10 MG tablet Take 1 tablet (10 mg total) by mouth daily with breakfast.  . propranolol (INDERAL) 20 MG tablet Take 1 tablet (20 mg total) by mouth 2 (two) times daily.  . [DISCONTINUED] predniSONE (DELTASONE) 5 MG tablet Take 1 tablet (5 mg total) by mouth daily with breakfast.   No facility-administered encounter medications on file as of 03/09/2018.    ALLERGIES: Allergies  Allergen Reactions  . Ceftin [Cefuroxime Axetil] Other (See Comments)  . Other  Hives  . Sulfa Antibiotics Hives    VACCINATION STATUS: Immunization History  Administered Date(s) Administered  . Influenza-Unspecified 07/09/2014    HPI Jody Howe is 72 y.o. female who presents today with a medical history as above. -She is status post therapeutic radioactive iodine on November 23, 2017 for hyperthyroidism confirmed on thyroid uptake and scan with 24-hour uptake of 43%.  - A dominant cold mass on the right lobe of the thyroid was biopsied and negative for malignancy. -On her last visit on January 11, 2018, she was still symptomatic of thyrotoxicosis more than 2 months after I-131 therapy was administered.   -Considering late response, she was put on observation. -She presents today for follow-up with continued symptoms including anxiety, +tremors, sleep disturbance. -Her previsit labs show still significant thyroid hormone burden including suppressed TSH and free T4 of 4, free T3 at 17. -She has lost 13 pounds since last visit. she denies family history of thyroid dysfunction. No family history of thyroid  cancer. - Around 2013, she was found to have cold nodule on her right thyroid which reportedly was biopsied and showed benign findings.  - Her repeat thyroid ultrasound shows 4.4 cm nodule in the general area of the previously biopsied nodule on the right lobe of her thyroid.  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  - She has history of osteoporosis on prolia treatment, currently after 2 doses.  Review of Systems  Constitutional: +   weight loss largely unintentional, + fatigue, + subjective hyperthermia.  Eyes: no blurry vision, no xerophthalmia ENT: no sore throat, + nodules palpated  in her thyroid,  no dysphagia/odynophagia, no hoarseness Cardiovascular: no Chest Pain, no Shortness of Breath, no palpitations on propranolol, no leg swelling Respiratory: no cough, no SOB Gastrointestinal: no Nausea/Vomiting/Diarhhea Musculoskeletal: no muscle/joint aches Skin: no rashes Neurological: + tremors  Psychiatric: no depression, + anxiety  Objective:    BP 118/70   Pulse 74   Ht _0  (1.499 m)   Wt 120 lb (54.4 kg)   BMI 24.24 kg/m   Wt Readings from Last 3 Encounters:  03/09/18 120 lb (54.4 kg)  01/11/18 133 lb (60.3 kg)  11/13/17 134 lb (60.8 kg)    Physical Exam  Constitutional: + Anxious state of mind, not in acute distress.   Eyes: PERRLA, EOMI, no exophthalmos ENT: moist mucous membranes, +  palpable asymmetric thyromegaly, right lobe larger than left lobe,  no cervical lymphadenopathy Cardiovascular: normal precordial activity, Regular Rate and Rhythm, no Murmur/Rubs/Gallops Respiratory:  adequate breathing efforts.  Musculoskeletal: no gross deformities, strength intact in all four extremities Skin: moist, warm, no rashes Neurological: + Tremors of outstretched hands    CMP     Component Value Date/Time   NA 138 07/26/2007 0931   K 3.8 07/26/2007 0931   CL 106 07/26/2007 0931   CO2 25 07/26/2007 0931   GLUCOSE  92 07/26/2007 0931   BUN 13 07/26/2007 0931   CREATININE 0.61 07/26/2007 0931   CALCIUM 8.6 07/26/2007 0931   GFRNONAA >60 07/26/2007 0931   GFRAA  07/26/2007 0931    >60        The eGFR has been calculated using the MDRD equation. This calculation has not been validated in all clinical   Review of her 2013 thyroid uptake and scan revealed a large cold nodule which was seen to arise from the inferior pole of the right lobe of the thyroid-reportedly biopsied with benign outcomes.  Cytology  reports show that they were benign with some Hurthle cells.  Thyroid ultrasound from October 03, 2016 showed 4.4 cm right inferior pole solid thyroid nodule on the right lobe of the thyroid.  -This nodule appeared as a cold mass on the thyroid uptake and scan on October 26, 2017 which showed 43% uptake in 24 hours consistent with hyperthyroidism.  Fine-needle aspiration of dominant right-sided nodule of the thyroid on November 08, 2017 Diagnosis THYROID, FINE NEEDLE ASPIRATION, RIGHT (SPECIMEN 1 OF 1 COLLECTED 11/08/2017 CONSISTENT WITH BENIGN FOLLICULAR NODULE (BETHESDA CATEGORY II  Lab Results  Component Value Date   TSH <0.01 (L) 03/02/2018   TSH <0.01 (L) 01/04/2018   TSH 0.01 (L) 10/03/2017   TSH 0.01 (A) 05/16/2017   TSH 1.54 11/26/2014   FREET4 4.0 (H) 03/02/2018   FREET4 4.7 (H) 01/04/2018   FREET4 2.3 (H) 10/03/2017   FREET4 0.73 11/26/2014     Assessment & Plan:   1. Cold Nodule on right Lobe of Thyroid -negative biopsy for malignancy 2. Hyperthyroidism -Persistent thyrotoxicosis status post I-131 therapy on November 24, 2027-likely treatment failure.  -Potential risks of untreated hyperthyroidism including thyroid storm is discussed with the patient again. -I discussed with her the need for repeat therapy and she agrees. -This treatment will be scheduled to be administered as soon as possible. -Due to significant thyroid hormone burden, I discussed and initiated prednisone 10 mg  p.o. daily for the next [redacted] weeks along with propanolol 20 mg p.o. twice daily.  Patient is aware of the fact that she would require thyroid hormone replacement subsequent to ablative therapy.  - She wishes to follow up with her PCP for treatment of osteoporosis currently on prolia a status post 2 doses. -She will return in 8 weeks with thyroid function test, TSH/free T4 for reevaluation. - I advised patient to maintain close follow up with Barry Dienes, NP for primary care needs.  Follow up plan: Return in about 8 weeks (around 05/04/2018) for follow up with labs after I131 therapy.   Glade Lloyd, MD [facility] [address] [place], [place] [address] Phone: [phone]  Fax: [phone]     03/09/2018, 1:05 PM  This note was partially dictated with voice recognition software. Similar sounding words can be transcribed inadequately or may not  be corrected upon review.

## 2018-03-13 ENCOUNTER — Encounter (HOSPITAL_COMMUNITY)
Admission: RE | Admit: 2018-03-13 | Discharge: 2018-03-13 | Disposition: A | Discharge status: Home / Self Care | Payer: Medicare Other | Source: Ambulatory Visit | Attending: "Endocrinology | Admitting: "Endocrinology

## 2018-03-13 ENCOUNTER — Encounter (HOSPITAL_COMMUNITY): Payer: Self-pay

## 2018-03-13 DIAGNOSIS — E0590 Thyrotoxicosis, unspecified without thyrotoxic crisis or storm: Secondary | ICD-10-CM

## 2018-03-13 MED ORDER — SODIUM IODIDE I-123 7.4 MBQ CAPS
290.0000 | ORAL_CAPSULE | Freq: Once | ORAL | Status: AC
  Administered 2018-03-13: 290 via ORAL

## 2018-03-14 ENCOUNTER — Encounter (HOSPITAL_COMMUNITY)
Admission: RE | Admit: 2018-03-14 | Discharge: 2018-03-14 | Disposition: A | Discharge status: Home / Self Care | Payer: Medicare Other | Source: Ambulatory Visit | Attending: "Endocrinology | Admitting: "Endocrinology

## 2018-03-16 ENCOUNTER — Encounter (HOSPITAL_COMMUNITY)
Admission: RE | Admit: 2018-03-16 | Discharge: 2018-03-16 | Disposition: A | Discharge status: Home / Self Care | Payer: Medicare Other | Source: Ambulatory Visit | Attending: "Endocrinology | Admitting: "Endocrinology

## 2018-03-16 DIAGNOSIS — E0590 Thyrotoxicosis, unspecified without thyrotoxic crisis or storm: Secondary | ICD-10-CM | POA: Diagnosis present

## 2018-03-16 MED ORDER — SODIUM IODIDE I 131 CAPSULE
40.0000 | Freq: Once | INTRAVENOUS | Status: AC | PRN
  Administered 2018-03-16: 39.3 via ORAL

## 2018-04-09 ENCOUNTER — Other Ambulatory Visit: Payer: Self-pay

## 2018-04-09 MED ORDER — PROPRANOLOL HCL 20 MG PO TABS: 20.0000 mg | ORAL_TABLET | Freq: Two times a day (BID) | ORAL | 2 refills | Status: DC

## 2018-05-08 LAB — T4, FREE: Free T4: 1.1 ng/dL (ref 0.8–1.8)

## 2018-05-08 LAB — TSH: TSH: 0.01 m[IU]/L — AB (ref 0.40–4.50)

## 2018-05-08 LAB — T3, FREE: T3, Free: 4.1 pg/mL (ref 2.3–4.2)

## 2018-05-14 ENCOUNTER — Ambulatory Visit: Payer: Medicare Other | Admitting: "Endocrinology

## 2018-05-14 ENCOUNTER — Encounter: Payer: Self-pay | Admitting: "Endocrinology

## 2018-05-14 VITALS — BP 112/70 | HR 62 | Ht 59.0 in | Wt 121.0 lb

## 2018-05-14 DIAGNOSIS — E0590 Thyrotoxicosis, unspecified without thyrotoxic crisis or storm: Secondary | ICD-10-CM

## 2018-05-14 MED ORDER — PROPRANOLOL HCL 20 MG PO TABS: 20.0000 mg | ORAL_TABLET | Freq: Every day | ORAL | 2 refills | Status: DC

## 2018-05-14 NOTE — Progress Notes (Signed)
Endocrinology follow-up note                                             05/14/2018, 2:15 PM   Subjective:    Patient ID: Jody Howe, female    DOB: 1946-09-10, PCP Barry Dienes, NP   Past Medical History:  Diagnosis Date  . Allergic rhinitis   . Arthritis   . Constipation   . Cystocele    mild on exam  . Goiter   . Medical history non-contributory   . Osteoporosis   . Plantar fasciitis    Past Surgical History:  Procedure Laterality Date  . BASAL CELL CARCINOMA EXCISION    . CATARACT EXTRACTION    . COLONOSCOPY WITH PROPOFOL N/A 02/15/2016   Procedure: COLONOSCOPY WITH PROPOFOL;  Surgeon: Hulen Luster, MD;  Location: [facility] ENDOSCOPY;  Service: Gastroenterology;  Laterality: N/A;  . NOSE SURGERY     excision and flap - [facility] left nose  . RETINAL LASER PROCEDURE  10/08   laster retinal surgery  . TONSILLECTOMY     Social History   Socioeconomic History  . Marital status: Married    Spouse name: Not on file  . Number of children: Not on file  . Years of education: Not on file  . Highest education level: Not on file  Occupational History  . Not on file  Social Needs  . Financial resource strain: Not on file  . Food insecurity:    Worry: Not on file    Inability: Not on file  . Transportation needs:    Medical: Not on file    Non-medical: Not on file  Tobacco Use  . Smoking status: Never Smoker  . Smokeless tobacco: Never Used  Substance and Sexual Activity  . Alcohol use: No  . Drug use: No  . Sexual activity: Not on file  Lifestyle  . Physical activity:    Days per week: Not on file    Minutes per session: Not on file  . Stress: Not on file  Relationships  . Social connections:    Talks on phone: Not on file    Gets together: Not on file    Attends religious service: Not on file    Active member of club or organization: Not on file    Attends meetings of clubs or organizations: Not on file    Relationship status: Not on file  Other Topics  Concern  . Not on file  Social History Narrative  . Not on file   Outpatient Encounter Medications as of 05/14/2018  Medication Sig  . calcium citrate-vitamin D (CITRACAL+D) 315-200 MG-UNIT per tablet Take 2 tablets by mouth 2 (two) times daily.  Marland Kitchen denosumab (PROLIA) 60 MG/ML SOLN injection Inject 60 mg into the skin every 6 (six) months. Administer in upper arm, thigh, or abdomen  . propranolol (INDERAL) 20 MG tablet Take 1 tablet (20 mg total) by mouth at bedtime.  . [DISCONTINUED] predniSONE (DELTASONE) 10 MG tablet Take 1 tablet (10 mg total) by mouth daily with breakfast.  . [DISCONTINUED] propranolol (INDERAL) 20 MG tablet Take 1 tablet (20 mg total) by mouth 2 (two) times daily.   No facility-administered encounter medications on file as of 05/14/2018.    ALLERGIES: Allergies  Allergen Reactions  . Ceftin [Cefuroxime Axetil] Other (See Comments)  . Other  Hives  . Sulfa Antibiotics Hives    VACCINATION STATUS: Immunization History  Administered Date(s) Administered  . Influenza-Unspecified 07/09/2014    HPI Jody Howe is 72 y.o. female who presents today with a medical history as above. -She is status post therapeutic radioactive iodine on November 23, 2017 for hyperthyroidism confirmed on thyroid uptake and scan with 24-hour uptake of 43%.  -She had to receive repeat thyroid ablation with I-131 on March 16, 2018 due to treatment failure of the first dose.  She returns with symptomatic improvement.  -She denies tremors, palpitations, heat intolerance.  She sleeps better.  She reports stable weight since last visit.  she denies family history of thyroid dysfunction. No family history of thyroid cancer. - Around 2013, she was found to have cold nodule on her right thyroid which reportedly was biopsied and showed benign findings.   - Her repeat thyroid ultrasound shows 4.4 cm nodule in the general area of the previously biopsied nodule on the right lobe of her thyroid.   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  - She has history of osteoporosis on prolia treatment, currently after 2 doses.  Review of Systems  Constitutional: + Stable weight his last visit, - fatigue, - subjective hyperthermia.  Eyes: no blurry vision, no xerophthalmia ENT: no sore throat, + nodules palpated  in her thyroid,  no dysphagia/odynophagia, no hoarseness Cardiovascular: no Chest Pain, no Shortness of Breath, no palpitations on propranolol, no leg swelling Respiratory: no cough, no SOB Gastrointestinal: no Nausea/Vomiting/Diarhhea Musculoskeletal: no muscle/joint aches Skin: no rashes Neurological: - tremors  Psychiatric: no depression, - anxiety  Objective:    BP 112/70   Pulse 62   Ht _0  (1.499 m)   Wt 121 lb (54.9 kg)   BMI 24.44 kg/m   Wt Readings from Last 3 Encounters:  05/14/18 121 lb (54.9 kg)  03/09/18 120 lb (54.4 kg)  01/11/18 133 lb (60.3 kg)    Physical Exam  Constitutional: Not in acute distress, stable state of mind.     Eyes: PERRLA, EOMI, no exophthalmos ENT: moist mucous membranes, +  palpable asymmetric thyromegaly, right lobe larger than left lobe,  no cervical lymphadenopathy Cardiovascular: normal precordial activity, Regular Rate and Rhythm, no Murmur/Rubs/Gallops Respiratory:  adequate breathing efforts.  Musculoskeletal: no gross deformities, strength intact in all four extremities Skin: moist, warm, no rashes Neurological: -Tremors of outstretched hands    CMP     Component Value Date/Time   NA 138 07/26/2007 0931   K 3.8 07/26/2007 0931   CL 106 07/26/2007 0931   CO2 25 07/26/2007 0931   GLUCOSE 92 07/26/2007 0931   BUN 13 07/26/2007 0931   CREATININE 0.61 07/26/2007 0931   CALCIUM 8.6 07/26/2007 0931   GFRNONAA >60 07/26/2007 0931   GFRAA  07/26/2007 0931    >60        The eGFR has been calculated using the MDRD equation. This calculation has not been validated in all  clinical   Review of her 2013 thyroid uptake and scan revealed a large cold nodule which was seen to arise from the inferior pole of the right lobe of the thyroid-reportedly biopsied with benign outcomes.  Cytology reports show that they were benign with some Hurthle cells.  Thyroid ultrasound from October 03, 2016 showed 4.4 cm right inferior pole solid thyroid nodule on the right lobe of the thyroid.  -This nodule appeared as a cold mass on the thyroid uptake and  scan on October 26, 2017 which showed 43% uptake in 24 hours consistent with hyperthyroidism.  Fine-needle aspiration of dominant right-sided nodule of the thyroid on November 08, 2017 Diagnosis THYROID, FINE NEEDLE ASPIRATION, RIGHT (SPECIMEN 1 OF 1 COLLECTED 11/08/2017 CONSISTENT WITH BENIGN FOLLICULAR NODULE (BETHESDA CATEGORY II  Lab Results  Component Value Date   TSH 0.01 (L) 05/07/2018   TSH <0.01 (L) 03/02/2018   TSH <0.01 (L) 01/04/2018   TSH 0.01 (L) 10/03/2017   TSH 0.01 (A) 05/16/2017   TSH 1.54 11/26/2014   FREET4 1.1 05/07/2018   FREET4 4.0 (H) 03/02/2018   FREET4 4.7 (H) 01/04/2018   FREET4 2.3 (H) 10/03/2017   FREET4 0.73 11/26/2014     Assessment & Plan:   1. Cold Nodule on right Lobe of Thyroid -negative biopsy for malignancy 2. Hyperthyroidism -She is status post I-131 therapy on 2 occasions, in February 2019 and June 2019.  It appears that she is responding to treatment with thyroid hormone approaching normal range while she still has suppressed TSH.    -She is not ready for thyroid hormone replacement.  I will proceed to lower her propranolol to 20 mg p.o. daily.  She will return in 9 weeks with repeat thyroid function tests.    Patient is aware of the fact that she will most likely require thyroid hormone replacement subsequent to ablative therapy.  - She wishes to follow up with her PCP for treatment of osteoporosis currently on prolia a status post 2 doses.  - I advised patient to maintain  close follow up with Barry Dienes, NP for primary care needs.  Follow up plan: Return in about 9 weeks (around 07/16/2018) for Follow up with Pre-visit Labs.   Glade Lloyd, MD [facility] [address] [place], [place] [address] Phone: [phone]  Fax: [phone]     05/14/2018, 2:15 PM  This note was partially dictated with voice recognition software. Similar sounding words can be transcribed inadequately or may not  be corrected upon review.

## 2018-07-04 IMAGING — NM NM RAI THERAPY FOR HYPERTHYROIDISM
1 series · 1 of 1 positions shown · non-contrast
Comparison: Thyroid uptake and scan 10/25/2017

CLINICAL DATA: Hyperthyroidism

EXAM:
RADIOACTIVE IODINE THERAPY FOR HYPERTHYROIDISM
TECHNIQUE: Procedure, benefits and risks of radioactive iodine therapy for
hyperthyroidism were discussed with the patient, as well as
alternatives.

[Series 1: bone statics · 2.07mm/px · 1 of 1 slices shown]
[im 1/1]
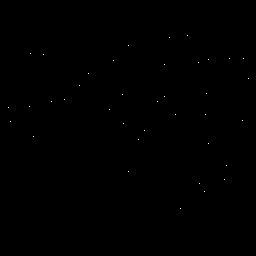

[1 of 1 positions shown; findings below may reference images not displayed]

Radiation safety issues and procedures were discussed, including
minimization of radiation risk to family and general public.

Written safety instructions were provided to the patient.

Patient's questions were answered.

Written informed consent for the therapy was obtained.

Timeout protocol followed.

Patient then received the radiopharmaceutical without immediate
complication.

RADIOPHARMACEUTICALS:  25.6 mCi M-BJB sodium iodide orally
IMPRESSION: Per oral administration of M-BJB sodium iodide for the treatment of
hyperthyroidism.

## 2018-07-10 LAB — TSH: TSH: 0.01 mIU/L — ABNORMAL LOW (ref 0.40–4.50)

## 2018-07-10 LAB — T4, FREE: FREE T4: 1.1 ng/dL (ref 0.8–1.8)

## 2018-07-10 LAB — T3, FREE: T3, Free: 4.1 pg/mL (ref 2.3–4.2)

## 2018-07-16 ENCOUNTER — Encounter: Payer: Self-pay | Admitting: "Endocrinology

## 2018-07-16 ENCOUNTER — Ambulatory Visit: Payer: Medicare Other | Admitting: "Endocrinology

## 2018-07-16 VITALS — BP 130/72 | HR 69 | Ht 59.0 in | Wt 127.0 lb

## 2018-07-16 DIAGNOSIS — E0590 Thyrotoxicosis, unspecified without thyrotoxic crisis or storm: Secondary | ICD-10-CM | POA: Diagnosis not present

## 2018-07-16 NOTE — Progress Notes (Signed)
Endocrinology follow-up note                                             07/16/2018, 12:56 PM   Subjective:    Patient ID: Jody Howe, female    DOB: 1946/04/07, PCP Barry Dienes, NP   Past Medical History:  Diagnosis Date  . Allergic rhinitis   . Arthritis   . Constipation   . Cystocele    mild on exam  . Goiter   . Medical history non-contributory   . Osteoporosis   . Plantar fasciitis    Past Surgical History:  Procedure Laterality Date  . BASAL CELL CARCINOMA EXCISION    . CATARACT EXTRACTION    . COLONOSCOPY WITH PROPOFOL N/A 02/15/2016   Procedure: COLONOSCOPY WITH PROPOFOL;  Surgeon: Hulen Luster, MD;  Location: [facility] ENDOSCOPY;  Service: Gastroenterology;  Laterality: N/A;  . NOSE SURGERY     excision and flap - [facility] left nose  . RETINAL LASER PROCEDURE  10/08   laster retinal surgery  . TONSILLECTOMY     Social History   Socioeconomic History  . Marital status: Married    Spouse name: Not on file  . Number of children: Not on file  . Years of education: Not on file  . Highest education level: Not on file  Occupational History  . Not on file  Social Needs  . Financial resource strain: Not on file  . Food insecurity:    Worry: Not on file    Inability: Not on file  . Transportation needs:    Medical: Not on file    Non-medical: Not on file  Tobacco Use  . Smoking status: Never Smoker  . Smokeless tobacco: Never Used  Substance and Sexual Activity  . Alcohol use: No  . Drug use: No  . Sexual activity: Not on file  Lifestyle  . Physical activity:    Days per week: Not on file    Minutes per session: Not on file  . Stress: Not on file  Relationships  . Social connections:    Talks on phone: Not on file    Gets together: Not on file    Attends religious service: Not on file    Active member of club or organization: Not on file    Attends meetings of clubs or organizations: Not on file    Relationship status: Not on file  Other  Topics Concern  . Not on file  Social History Narrative  . Not on file   Outpatient Encounter Medications as of 07/16/2018  Medication Sig  . Glucosamine-Chondroitin 500-400 MG CAPS Take by mouth 2 (two) times daily.  . Magnesium 250 MG TABS Take by mouth daily.  Marland Kitchen denosumab (PROLIA) 60 MG/ML SOLN injection Inject 60 mg into the skin every 6 (six) months. Administer in upper arm, thigh, or abdomen  . propranolol (INDERAL) 20 MG tablet Take 1 tablet (20 mg total) by mouth at bedtime. (Patient taking differently: Take 10 mg by mouth at bedtime. )  . [DISCONTINUED] calcium citrate-vitamin D (CITRACAL+D) 315-200 MG-UNIT per tablet Take 2 tablets by mouth 2 (two) times daily.   No facility-administered encounter medications on file as of 07/16/2018.    ALLERGIES: Allergies  Allergen Reactions  . Ceftin [Cefuroxime Axetil] Other (See Comments)  . Other Hives  . Sulfa  Antibiotics Hives    VACCINATION STATUS: Immunization History  Administered Date(s) Administered  . Influenza-Unspecified 07/09/2014    HPI Jody Howe is 72 y.o. female who presents today with a medical history as above. -She is status post therapeutic radioactive iodine on November 23, 2017 for hyperthyroidism confirmed on thyroid uptake and scan with 24-hour uptake of 43%.  -She had to receive repeat thyroid ablation with I-131 on March 16, 2018 due to treatment failure of the first dose.  She returns with symptomatic improvement.  -She denies tremors, palpitations, heat intolerance.  She sleeps better.  She slowly regaining her weight towards her routine weight of 130-135 pounds.  she denies family history of thyroid dysfunction. No family history of thyroid cancer. - Around 2013, she was found to have cold nodule on her right thyroid which reportedly was biopsied and showed benign findings.   - Her repeat thyroid ultrasound shows 4.4 cm nodule in the general area of the previously biopsied nodule on the right  lobe of her thyroid.  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  - She has history of osteoporosis on prolia treatment, currently after 2 doses.  Review of Systems  Constitutional: + Steady weight gain, - fatigue, - subjective hyperthermia.  Eyes: no blurry vision, no xerophthalmia ENT: no sore throat, + nodules palpated  in her thyroid,  no dysphagia/odynophagia, no hoarseness Cardiovascular: no Chest Pain, no Shortness of Breath, no palpitations on propranolol, no leg swelling Respiratory: no cough, no SOB Gastrointestinal: no Nausea/Vomiting/Diarhhea Musculoskeletal: no muscle/joint aches Skin: no rashes Neurological: - tremors  Psychiatric: no depression, - anxiety  Objective:    BP 130/72   Pulse 69   Ht '4\' 11"'  (1.499 m)   Wt 127 lb (57.6 kg)   BMI 25.65 kg/m   Wt Readings from Last 3 Encounters:  07/16/18 127 lb (57.6 kg)  05/14/18 121 lb (54.9 kg)  03/09/18 120 lb (54.4 kg)    Physical Exam  Constitutional: Not in acute distress, stable state of mind.     Eyes: PERRLA, EOMI, no exophthalmos ENT: moist mucous membranes, +  palpable asymmetric thyromegaly, right lobe larger than left lobe,  no cervical lymphadenopathy Cardiovascular: normal precordial activity, Regular Rate and Rhythm, no Murmur/Rubs/Gallops Respiratory:  adequate breathing efforts.  Musculoskeletal: no gross deformities, strength intact in all four extremities Skin: moist, warm, no rashes Neurological: -Tremors of outstretched hands    CMP     Component Value Date/Time   NA 138 07/26/2007 0931   K 3.8 07/26/2007 0931   CL 106 07/26/2007 0931   CO2 25 07/26/2007 0931   GLUCOSE 92 07/26/2007 0931   BUN 13 07/26/2007 0931   CREATININE 0.61 07/26/2007 0931   CALCIUM 8.6 07/26/2007 0931   GFRNONAA >60 07/26/2007 0931   GFRAA  07/26/2007 0931    >60        The eGFR has been calculated using the MDRD equation. This calculation has not  been validated in all clinical   Review of her 2013 thyroid uptake and scan revealed a large cold nodule which was seen to arise from the inferior pole of the right lobe of the thyroid-reportedly biopsied with benign outcomes.  Cytology reports show that they were benign with some Hurthle cells.  Thyroid ultrasound from October 03, 2016 showed 4.4 cm right inferior pole solid thyroid nodule on the right lobe of the thyroid.  -This nodule appeared as a cold mass on the thyroid uptake and scan  on October 26, 2017 which showed 43% uptake in 24 hours consistent with hyperthyroidism.  Fine-needle aspiration of dominant right-sided nodule of the thyroid on November 08, 2017 Diagnosis THYROID, FINE NEEDLE ASPIRATION, RIGHT (SPECIMEN 1 OF 1 COLLECTED 11/08/2017 CONSISTENT WITH BENIGN FOLLICULAR NODULE (BETHESDA CATEGORY II  Results for Jody, Howe (MRN [id number]) as of 07/16/2018 12:59  Ref. Range 01/04/2018 13:34 03/02/2018 08:53 05/07/2018 11:29 07/09/2018 10:11  TSH Latest Ref Range: 0.40 - 4.50 mIU/L <0.01 (L) <0.01 (L) 0.01 (L) 0.01 (L)  Triiodothyronine,Free,Serum Latest Ref Range: 2.3 - 4.2 pg/mL  17.0 (H) 4.1 4.1  T4,Free(Direct) Latest Ref Range: 0.8 - 1.8 ng/dL 4.7 (H) 4.0 (H) 1.1 1.1    Assessment & Plan:   1. Cold Nodule on right Lobe of Thyroid -negative biopsy for malignancy 2. Hyperthyroidism -She is status post I-131 therapy on 2 occasions, in February 2019 and June 2019.  It appears that she is responding to treatment with thyroid hormone approaching normal range while she still has suppressed TSH.    -She is not ready for thyroid hormone replacement.  I will proceed to lower her propranolol to 10 mg p.o. twice daily.    She will return in 12 weeks with repeat thyroid function tests.    Patient is aware of the fact that she will most likely require thyroid hormone replacement subsequent to ablative therapy.  - She wishes to follow up with her PCP for treatment of osteoporosis  currently on prolia a status post 2 doses.  - I advised patient to maintain close follow up with Barry Dienes, NP for primary care needs.  Follow up plan: Return in about 3 months (around 10/16/2018) for Follow up with Pre-visit Labs.   Glade Lloyd, MD [facility] [address] [place], [place] [address] Phone: [phone]  Fax: [phone]     07/16/2018, 12:56 PM  This note was partially dictated with voice recognition software. Similar sounding words can be transcribed inadequately or may not  be corrected upon review.

## 2018-07-30 ENCOUNTER — Telehealth: Payer: Self-pay | Admitting: "Endocrinology

## 2018-07-30 MED ORDER — PROPRANOLOL HCL 20 MG PO TABS: 20.0000 mg | ORAL_TABLET | Freq: Every day | ORAL | 2 refills | Status: DC

## 2018-07-30 NOTE — Telephone Encounter (Signed)
Jody Howe is asking for a refill on her propranolol (INDERAL) 20 MG tablet  Please advise?

## 2018-10-01 ENCOUNTER — Ambulatory Visit: Payer: Medicare Other | Admitting: Podiatry

## 2018-10-01 ENCOUNTER — Encounter: Payer: Self-pay | Admitting: Podiatry

## 2018-10-01 DIAGNOSIS — Q828 Other specified congenital malformations of skin: Secondary | ICD-10-CM

## 2018-10-01 NOTE — Progress Notes (Signed)
This patient presents to the office with a painful skin lesion under left forefoot.  This is painful walking and wearing her shoes.  She self treats with a pumice stone but the skin callus has become painful.  She presents to the office for preventative foot care services.  Vascular  Dorsalis pedis and posterior tibial pulses are palpable  B/L.  Capillary return  WNL.  Temperature gradient is  WNL.  Skin turgor  WNL  Sensorium  Senn Weinstein monofilament wire  WNL. Normal tactile sensation.  Nail Exam  Patient has normal nails with no evidence of bacterial or fungal infection.  Orthopedic  Exam  Muscle tone and muscle strength  WNL.  No limitations of motion feet  B/L.  No crepitus or joint effusion noted.  Foot type is unremarkable and digits show no abnormalities.  Bony prominences are unremarkable.  Skin  No open lesions.  Normal skin texture and turgor.  Porokeratosis sub 3 left forefoot.  Porokeratosis left foot.  ROV.  Debride porokeratosis left foot.  Prescribe powerstep insoles  B/L.  RTC prn   Gardiner Barefoot DPM

## 2018-10-12 LAB — TSH: TSH: <0.01 mIU/L — ABNORMAL LOW (ref 0.40–4.50)

## 2018-10-12 LAB — T4, FREE: FREE T4: 1.3 ng/dL (ref 0.8–1.8)

## 2018-10-12 LAB — T3, FREE: T3 FREE: 4.4 pg/mL — AB (ref 2.3–4.2)

## 2018-10-19 ENCOUNTER — Encounter: Payer: Self-pay | Admitting: "Endocrinology

## 2018-10-19 ENCOUNTER — Ambulatory Visit (INDEPENDENT_AMBULATORY_CARE_PROVIDER_SITE_OTHER): Payer: Medicare Other | Admitting: "Endocrinology

## 2018-10-19 VITALS — BP 110/77 | HR 57 | Ht 59.0 in | Wt 136.0 lb

## 2018-10-19 DIAGNOSIS — E0590 Thyrotoxicosis, unspecified without thyrotoxic crisis or storm: Secondary | ICD-10-CM | POA: Diagnosis not present

## 2018-10-19 NOTE — Progress Notes (Signed)
Endocrinology follow-up note                                             10/19/2018, 9:48 AM   Subjective:    Patient ID: Jody Howe, female    DOB: Nov 09, 1945, PCP Barry Dienes, NP   Past Medical History:  Diagnosis Date  . Allergic rhinitis   . Arthritis   . Constipation   . Cystocele    mild on exam  . Goiter   . Medical history non-contributory   . Osteoporosis   . Plantar fasciitis    Past Surgical History:  Procedure Laterality Date  . BASAL CELL CARCINOMA EXCISION    . CATARACT EXTRACTION    . COLONOSCOPY WITH PROPOFOL N/A 02/15/2016   Procedure: COLONOSCOPY WITH PROPOFOL;  Surgeon: Hulen Luster, MD;  Location: [facility] ENDOSCOPY;  Service: Gastroenterology;  Laterality: N/A;  . NOSE SURGERY     excision and flap - [facility] left nose  . RETINAL LASER PROCEDURE  10/08   laster retinal surgery  . TONSILLECTOMY     Social History   Socioeconomic History  . Marital status: Married    Spouse name: Not on file  . Number of children: Not on file  . Years of education: Not on file  . Highest education level: Not on file  Occupational History  . Not on file  Social Needs  . Financial resource strain: Not on file  . Food insecurity:    Worry: Not on file    Inability: Not on file  . Transportation needs:    Medical: Not on file    Non-medical: Not on file  Tobacco Use  . Smoking status: Never Smoker  . Smokeless tobacco: Never Used  Substance and Sexual Activity  . Alcohol use: No  . Drug use: No  . Sexual activity: Not on file  Lifestyle  . Physical activity:    Days per week: Not on file    Minutes per session: Not on file  . Stress: Not on file  Relationships  . Social connections:    Talks on phone: Not on file    Gets together: Not on file    Attends religious service: Not on file    Active member of club or organization: Not on file    Attends meetings of clubs or organizations: Not on file    Relationship status: Not on file  Other Topics  Concern  . Not on file  Social History Narrative  . Not on file   Outpatient Encounter Medications as of 10/19/2018  Medication Sig  . denosumab (PROLIA) 60 MG/ML SOLN injection Inject 60 mg into the skin every 6 (six) months. Administer in upper arm, thigh, or abdomen  . Glucosamine-Chondroitin 500-400 MG CAPS Take by mouth 2 (two) times daily.  . Magnesium 250 MG TABS Take by mouth daily.  . [DISCONTINUED] alendronate (FOSAMAX) 70 MG tablet alendronate 70 mg tablet  . [DISCONTINUED] influenza vac split quadrivalent (AFLURIA QUADRIVALENT) injection Afluria Quad 2019-2020 60 mcg (15 mcg x 4)/0.5 mL intramuscular susp.  . [DISCONTINUED] propranolol (INDERAL) 20 MG tablet propranolol 20 mg tablet  . [DISCONTINUED] Sod Picosulfate-Mag Ox-Cit Acd ([place]) 10-3.5-12 MG-GM-GM PACK Prepopik 10 mg-3.5 gram-12 gram oral powder packet  . [DISCONTINUED] trimethoprim-polymyxin b (POLYTRIM) ophthalmic solution polymyxin B sulfate 10,000 unit-trimethoprim 1 mg/mL eye drops  No facility-administered encounter medications on file as of 10/19/2018.    ALLERGIES: Allergies  Allergen Reactions  . Ceftin [Cefuroxime Axetil] Other (See Comments)  . Other Hives  . Sulfa Antibiotics Hives    VACCINATION STATUS: Immunization History  Administered Date(s) Administered  . Influenza-Unspecified 07/09/2014    HPI Jody Howe is 73 y.o. female who presents today with a medical history as above. -She is status post therapeutic radioactive iodine on November 23, 2017 for hyperthyroidism confirmed on thyroid uptake and scan with 24-hour uptake of 43%.  -She had to receive repeat thyroid ablation with I-131 on March 16, 2018 due to treatment failure of the first dose.  She returns with symptomatic improvement, continues to gain her previously lost pounds.  -She denies tremors, palpitations, heat intolerance.  She sleeps better.   she denies family history of thyroid dysfunction. No family history of  thyroid cancer. - Around 2013, she was found to have cold nodule on her right thyroid which reportedly was biopsied and showed benign findings.   - Her repeat thyroid ultrasound shows 4.4 cm nodule in the general area of the previously biopsied nodule on the right lobe of her thyroid.  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  - She has history of osteoporosis on prolia treatment.  Review of Systems  Constitutional: + Steady weight gain, - fatigue, - subjective hyperthermia.  Eyes: no blurry vision, no xerophthalmia ENT: no sore throat, + nodules palpated  in her thyroid,  no dysphagia/odynophagia, no hoarseness Cardiovascular: no Chest Pain, no Shortness of Breath, no palpitations on propranolol, no leg swelling Musculoskeletal: no muscle/joint aches Skin: no rashes Neurological: - tremors  Psychiatric: no depression, - anxiety  Objective:    BP 110/77   Pulse (!) 57   Ht _0  (1.499 m)   Wt 136 lb (61.7 kg)   BMI 27.47 kg/m   Wt Readings from Last 3 Encounters:  10/19/18 136 lb (61.7 kg)  07/16/18 127 lb (57.6 kg)  05/14/18 121 lb (54.9 kg)    Physical Exam  Constitutional: Not in acute distress, stable state of mind.     Eyes: PERRLA, EOMI, no exophthalmos ENT: moist mucous membranes, +  palpable asymmetric thyromegaly, right lobe larger than left lobe,  no cervical lymphadenopathy Cardiovascular: normal precordial activity, + bradycardia ,  no Murmur/Rubs/Gallops Respiratory:  adequate breathing efforts.  Musculoskeletal: no gross deformities, strength intact in all four extremities Skin: moist, warm, no rashes Neurological: -Tremors of outstretched hands    CMP     Component Value Date/Time   NA 138 07/26/2007 0931   K 3.8 07/26/2007 0931   CL 106 07/26/2007 0931   CO2 25 07/26/2007 0931   GLUCOSE 92 07/26/2007 0931   BUN 13 07/26/2007 0931   CREATININE 0.61 07/26/2007 0931   CALCIUM 8.6 07/26/2007 0931    GFRNONAA >60 07/26/2007 0931   GFRAA  07/26/2007 0931    >60        The eGFR has been calculated using the MDRD equation. This calculation has not been validated in all clinical   Review of her 2013 thyroid uptake and scan revealed a large cold nodule which was seen to arise from the inferior pole of the right lobe of the thyroid-reportedly biopsied with benign outcomes.  Cytology reports show that they were benign with some Hurthle cells.  Thyroid ultrasound from October 03, 2016 showed 4.4 cm right inferior pole solid thyroid nodule on the right lobe of  the thyroid.  -This nodule appeared as a cold mass on the thyroid uptake and scan on October 26, 2017 which showed 43% uptake in 24 hours consistent with hyperthyroidism.  Fine-needle aspiration of dominant right-sided nodule of the thyroid on November 08, 2017 Diagnosis THYROID, FINE NEEDLE ASPIRATION, RIGHT (SPECIMEN 1 OF 1 COLLECTED 11/08/2017 CONSISTENT WITH BENIGN FOLLICULAR NODULE (BETHESDA CATEGORY II)  Recent Results (from the past 2160 hour(s))  TSH     Status: Abnormal   Collection Time: 10/12/18  8:45 AM  Result Value Ref Range   TSH <0.01 (L) 0.40 - 4.50 mIU/L  T4, free     Status: None   Collection Time: 10/12/18  8:45 AM  Result Value Ref Range   Free T4 1.3 0.8 - 1.8 ng/dL  T3, free     Status: Abnormal   Collection Time: 10/12/18  8:45 AM  Result Value Ref Range   T3, Free 4.4 (H) 2.3 - 4.2 pg/mL       Assessment & Plan:   1. Cold Nodule on right Lobe of Thyroid -negative biopsy for malignancy 2. Hyperthyroidism -She is status post I-131 therapy on 2 occasions, in February 2019 and June 2019.  Clinically, it appears that she is responding to treatment with thyroid hormone approaching normal range while she still has suppressed TSH.  She has received total of 64.9 mCi of I-131.  She will be observed without any further intervention at this time. -She is not ready for thyroid hormone replacement.  Due to  clinically significant bradycardia, she is advised to discontinue propranolol .    She will return in 12 weeks with repeat thyroid function tests.   -If her thyroid function tests remain above normal, she will be retried with methimazole.  Thyroidectomy will be considered in the event that she would not respond to methimazole treatment.   Patient is aware of the fact that she will most likely require thyroid hormone replacement subsequent to ablative therapy.  - She wishes to follow up with her PCP for treatment of osteoporosis currently on prolia a status post 2 doses.  - I advised patient to maintain close follow up with Barry Dienes, NP for primary care needs.  Follow up plan: Return in about 3 months (around 01/18/2019) for Follow up with Pre-visit Labs.   Glade Lloyd, MD [facility] [address] [place], [place] [address] Phone: [phone]  Fax: [phone]     10/19/2018, 9:48 AM  This note was partially dictated with voice recognition software. Similar sounding words can be transcribed inadequately or may not  be corrected upon review.

## 2018-11-28 ENCOUNTER — Other Ambulatory Visit (HOSPITAL_COMMUNITY): Payer: Self-pay | Admitting: Student

## 2018-11-28 DIAGNOSIS — Z1231 Encounter for screening mammogram for malignant neoplasm of breast: Secondary | ICD-10-CM

## 2019-01-14 ENCOUNTER — Ambulatory Visit (HOSPITAL_COMMUNITY): Payer: Medicare Other

## 2019-01-15 LAB — TSH: TSH: 0.01 — AB (ref <=5.90)

## 2019-01-21 ENCOUNTER — Encounter: Payer: Self-pay | Admitting: "Endocrinology

## 2019-01-21 ENCOUNTER — Other Ambulatory Visit: Payer: Self-pay | Admitting: "Endocrinology

## 2019-01-21 ENCOUNTER — Other Ambulatory Visit: Payer: Self-pay

## 2019-01-21 ENCOUNTER — Ambulatory Visit (INDEPENDENT_AMBULATORY_CARE_PROVIDER_SITE_OTHER): Payer: Medicare Other | Admitting: "Endocrinology

## 2019-01-21 DIAGNOSIS — E0590 Thyrotoxicosis, unspecified without thyrotoxic crisis or storm: Secondary | ICD-10-CM

## 2019-01-21 NOTE — Progress Notes (Signed)
01/21/2019, 4:25 PM                                Endocrinology Telehealth Visit Follow up Note -During COVID -19 Pandemic  I connected with@ on 01/21/2019   by telephone and verified that I am speaking with the correct person using two identifiers. Jody Howe, July 19, 1946. she has verbally consented to this visit. All issues noted in this document were discussed and addressed. The format was not optimal for physical exam.    Subjective:    Patient ID: Jody Howe, female    DOB: 05-14-1946, PCP Barry Dienes, NP   Past Medical History:  Diagnosis Date  . Allergic rhinitis   . Arthritis   . Constipation   . Cystocele    mild on exam  . Goiter   . Medical history non-contributory   . Osteoporosis   . Plantar fasciitis    Past Surgical History:  Procedure Laterality Date  . BASAL CELL CARCINOMA EXCISION    . CATARACT EXTRACTION    . COLONOSCOPY WITH PROPOFOL N/A 02/15/2016   Procedure: COLONOSCOPY WITH PROPOFOL;  Surgeon: Hulen Luster, MD;  Location: [facility] ENDOSCOPY;  Service: Gastroenterology;  Laterality: N/A;  . NOSE SURGERY     excision and flap - [facility] left nose  . RETINAL LASER PROCEDURE  10/08   laster retinal surgery  . TONSILLECTOMY     Social History   Socioeconomic History  . Marital status: Married    Spouse name: Not on file  . Number of children: Not on file  . Years of education: Not on file  . Highest education level: Not on file  Occupational History  . Not on file  Social Needs  . Financial resource strain: Not on file  . Food insecurity:    Worry: Not on file    Inability: Not on file  . Transportation needs:    Medical: Not on file    Non-medical: Not on file  Tobacco Use  . Smoking status: Never Smoker  . Smokeless tobacco: Never Used  Substance and Sexual Activity  . Alcohol use: No  . Drug use: No  . Sexual activity: Not on file  Lifestyle  . Physical activity:    Days per week:  Not on file    Minutes per session: Not on file  . Stress: Not on file  Relationships  . Social connections:    Talks on phone: Not on file    Gets together: Not on file    Attends religious service: Not on file    Active member of club or organization: Not on file    Attends meetings of clubs or organizations: Not on file    Relationship status: Not on file  Other Topics Concern  . Not on file  Social History Narrative  . Not on file   Outpatient Encounter Medications as of 01/21/2019  Medication Sig  . denosumab (PROLIA) 60 MG/ML SOLN injection Inject 60 mg into the skin every 6 (six) months. Administer in upper arm, thigh, or abdomen  . Glucosamine-Chondroitin 500-400 MG CAPS Take by mouth 2 (two) times daily.  Marland Kitchen  Magnesium 250 MG TABS Take by mouth daily.   No facility-administered encounter medications on file as of 01/21/2019.    ALLERGIES: Allergies  Allergen Reactions  . Ceftin [Cefuroxime Axetil] Other (See Comments)  . Other Hives  . Sulfa Antibiotics Hives    VACCINATION STATUS: Immunization History  Administered Date(s) Administered  . Influenza-Unspecified 07/09/2014    HPI Jody Howe is 73 y.o. female who presents today with a medical history as above. -She is status post therapeutic radioactive iodine on November 23, 2017 for hyperthyroidism confirmed on thyroid uptake and scan with 24-hour uptake of 43%.  -She had to receive repeat thyroid ablation with I-131 on March 16, 2018 due to treatment failure of the first dose.  She is engaged in telehealth via telephone for follow-up.  She is not on thyroid hormone replacement nor antithyroid medication at this time.  She has steady weight since last visit, denies palpitations heat intolerance, tremor.   she denies family history of thyroid dysfunction. No family history of thyroid cancer. - Around 2013, she was found to have cold nodule on her right thyroid which reportedly was biopsied and showed benign  findings.   - Her repeat thyroid ultrasound shows 4.4 cm nodule in the general area of the previously biopsied nodule on the right lobe of her thyroid.  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  - She has history of osteoporosis on prolia treatment.  Review of Systems Limited as above.  Objective:    There were no vitals taken for this visit.  Wt Readings from Last 3 Encounters:  10/19/18 136 lb (61.7 kg)  07/16/18 127 lb (57.6 kg)  05/14/18 121 lb (54.9 kg)    Physical Exam  CMP     Component Value Date/Time   NA 138 07/26/2007 0931   K 3.8 07/26/2007 0931   CL 106 07/26/2007 0931   CO2 25 07/26/2007 0931   GLUCOSE 92 07/26/2007 0931   BUN 13 07/26/2007 0931   CREATININE 0.61 07/26/2007 0931   CALCIUM 8.6 07/26/2007 0931   GFRNONAA >60 07/26/2007 0931   GFRAA  07/26/2007 0931    >60        The eGFR has been calculated using the MDRD equation. This calculation has not been validated in all clinical   Review of her 2013 thyroid uptake and scan revealed a large cold nodule which was seen to arise from the inferior pole of the right lobe of the thyroid-reportedly biopsied with benign outcomes.  Cytology reports show that they were benign with some Hurthle cells.  Thyroid ultrasound from October 03, 2016 showed 4.4 cm right inferior pole solid thyroid nodule on the right lobe of the thyroid.  -This nodule appeared as a cold mass on the thyroid uptake and scan on October 26, 2017 which showed 43% uptake in 24 hours consistent with hyperthyroidism.  Fine-needle aspiration of dominant right-sided nodule of the thyroid on November 08, 2017 Diagnosis THYROID, FINE NEEDLE ASPIRATION, RIGHT (SPECIMEN 1 OF 1 COLLECTED 11/08/2017 CONSISTENT WITH BENIGN FOLLICULAR NODULE (BETHESDA CATEGORY II)  Recent Results (from the past 2160 hour(s))  TSH     Status: Abnormal   Collection Time: 01/15/19 12:00 AM  Result Value Ref Range   TSH 0.01  (A) 0.41 - 5.90    Recent Results (from the past 2160 hour(s))  TSH     Status: Abnormal   Collection Time: 01/15/19 12:00 AM  Result Value Ref Range   TSH 0.01 (  A) 0.41 - 5.90  Free T4 1.6, total T4 8.2    Assessment & Plan:    1. Hyperthyroidism -She is status post I-131 therapy on 2 occasions, in February 2019 and June 2019.  Clinically, it appears that she is responding to treatment with thyroid hormone approaching normal range while she still has suppressed TSH.  She has received total of 64.9 mCi of I-131.  She will be observed without any further intervention at this time. -She is not ready for thyroid hormone replacement.   2. Cold Nodule on right Lobe of Thyroid -negative biopsy for malignancy -She will be returning for follow-up visit in 6 months with repeat thyroid function tests.  If she continues to have clinically significant primary hyperthyroidism, she will be considered for retrial of methimazole, thyroidectomy if she does not have clinical response to methimazole.     Patient is aware of the fact that she will most likely require thyroid hormone replacement subsequent to ablative therapy.  She is advised to call clinic if she develops symptoms of relapse of thyrotoxicosis including rapid weight loss, palpitations, heat intolerance, tremors.  - She wishes to follow up with her PCP for treatment of osteoporosis currently on prolia a status post 2 doses.  - I advised patient to maintain close follow up with Barry Dienes, NP for primary care needs.  Follow up plan: Return in about 6 months (around 07/23/2019) for Follow up with Pre-visit Labs.   Glade Lloyd, MD [facility] [address] [place], [place] [address] Phone: [phone]  Fax: [phone]     01/21/2019, 4:25 PM  This note was partially dictated with voice recognition software. Similar sounding words can be transcribed inadequately or may not  be  corrected upon review.

## 2019-02-25 ENCOUNTER — Other Ambulatory Visit: Payer: Self-pay

## 2019-02-25 ENCOUNTER — Ambulatory Visit (HOSPITAL_COMMUNITY)
Admission: RE | Admit: 2019-02-25 | Discharge: 2019-02-25 | Disposition: A | Discharge status: Home / Self Care | Payer: Medicare Other | Source: Ambulatory Visit | Attending: Student | Admitting: Student

## 2019-02-25 DIAGNOSIS — Z1231 Encounter for screening mammogram for malignant neoplasm of breast: Secondary | ICD-10-CM | POA: Diagnosis not present

## 2019-06-10 ENCOUNTER — Telehealth: Payer: Self-pay | Admitting: Podiatry

## 2019-06-10 NOTE — Telephone Encounter (Signed)
Pt is requesting a medical records release form be mailed to her with the [facility] address. She will fill out and complete and mail back to the [place] office for her medical records to be sent to another facility.

## 2019-07-17 LAB — T4, FREE: Free T4: 0.9 ng/dL (ref 0.8–1.8)

## 2019-07-17 LAB — TSH: TSH: 0.03 mIU/L — ABNORMAL LOW (ref 0.40–4.50)

## 2019-07-17 LAB — T3, FREE: T3, Free: 3.6 pg/mL (ref 2.3–4.2)

## 2019-07-23 ENCOUNTER — Encounter: Payer: Self-pay | Admitting: "Endocrinology

## 2019-07-23 ENCOUNTER — Other Ambulatory Visit: Payer: Self-pay

## 2019-07-23 ENCOUNTER — Ambulatory Visit (INDEPENDENT_AMBULATORY_CARE_PROVIDER_SITE_OTHER): Payer: Medicare Other | Admitting: "Endocrinology

## 2019-07-23 DIAGNOSIS — E0590 Thyrotoxicosis, unspecified without thyrotoxic crisis or storm: Secondary | ICD-10-CM | POA: Diagnosis not present

## 2019-07-23 NOTE — Progress Notes (Signed)
07/23/2019, 5:37 PM                                                        Endocrinology Telehealth Visit Follow up Note -During COVID -19 Pandemic  I connected with Jody Howe on 07/23/2019   by telephone and verified that I am speaking with the correct person using two identifiers. Jody Howe, 1946-01-09. she has verbally consented to this visit. All issues noted in this document were discussed and addressed. The format was not optimal for physical exam.   Subjective:    Patient ID: Jody Howe, female    DOB: 1945-11-30, PCP Barry Dienes, NP   Past Medical History:  Diagnosis Date  . Allergic rhinitis   . Arthritis   . Constipation   . Cystocele    mild on exam  . Goiter   . Medical history non-contributory   . Osteoporosis   . Plantar fasciitis    Past Surgical History:  Procedure Laterality Date  . BASAL CELL CARCINOMA EXCISION    . CATARACT EXTRACTION    . COLONOSCOPY WITH PROPOFOL N/A 02/15/2016   Procedure: COLONOSCOPY WITH PROPOFOL;  Surgeon: Hulen Luster, MD;  Location: [facility] ENDOSCOPY;  Service: Gastroenterology;  Laterality: N/A;  . NOSE SURGERY     excision and flap - [facility] left nose  . RETINAL LASER PROCEDURE  10/08   laster retinal surgery  . TONSILLECTOMY     Social History   Socioeconomic History  . Marital status: Married    Spouse name: Not on file  . Number of children: Not on file  . Years of education: Not on file  . Highest education level: Not on file  Occupational History  . Not on file  Social Needs  . Financial resource strain: Not on file  . Food insecurity    Worry: Not on file    Inability: Not on file  . Transportation needs    Medical: Not on file    Non-medical: Not on file  Tobacco Use  . Smoking status: Never Smoker  . Smokeless tobacco: Never Used  Substance and Sexual Activity  . Alcohol use: No  . Drug use: No  . Sexual activity: Not on file  Lifestyle   . Physical activity    Days per week: Not on file    Minutes per session: Not on file  . Stress: Not on file  Relationships  . Social Herbalist on phone: Not on file    Gets together: Not on file    Attends religious service: Not on file    Active member of club or organization: Not on file    Attends meetings of clubs or organizations: Not on file    Relationship status: Not on file  Other Topics Concern  . Not on file  Social History Narrative  . Not on file   Outpatient Encounter Medications as of 07/23/2019  Medication Sig  . denosumab (PROLIA) 60 MG/ML SOLN injection Inject 60 mg into the skin  every 6 (six) months. Administer in upper arm, thigh, or abdomen  . Glucosamine-Chondroitin 500-400 MG CAPS Take by mouth 2 (two) times daily.  . Magnesium 250 MG TABS Take by mouth daily.   No facility-administered encounter medications on file as of 07/23/2019.    ALLERGIES: Allergies  Allergen Reactions  . Ceftin [Cefuroxime Axetil] Other (See Comments)  . Other Hives  . Sulfa Antibiotics Hives    VACCINATION STATUS: Immunization History  Administered Date(s) Administered  . Influenza-Unspecified 07/09/2014    HPI Jody Howe is 73 y.o. female who presents today with a medical history as above. -She is status post therapeutic radioactive iodine on November 23, 2017 for hyperthyroidism confirmed on thyroid uptake and scan with 24-hour uptake of 43%.  -She had to receive repeat thyroid ablation with I-131 on March 16, 2018 due to treatment failure of the first dose.    -She is being engaged in telehealth via telephone for follow-up.  She is not initiated on thyroid hormone replacement.  She continues to feel better, does not have new complaints today.  She has a steady weight at 135 pound.    -She denies palpitations, tremors, nor heat intolerance.  she denies family history of thyroid dysfunction. No family history of thyroid cancer. - Around 2013, she  was found to have cold nodule on her right thyroid which reportedly was biopsied and showed benign findings.   - Her repeat thyroid ultrasound shows 4.4 cm nodule in the general area of the previously biopsied nodule on the right lobe of her thyroid.  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  - She has history of osteoporosis on prolia treatment.  Review of Systems Limited as above.  Objective:    There were no vitals taken for this visit.  Wt Readings from Last 3 Encounters:  10/19/18 136 lb (61.7 kg)  07/16/18 127 lb (57.6 kg)  05/14/18 121 lb (54.9 kg)    Physical Exam  CMP     Component Value Date/Time   NA 138 07/26/2007 0931   K 3.8 07/26/2007 0931   CL 106 07/26/2007 0931   CO2 25 07/26/2007 0931   GLUCOSE 92 07/26/2007 0931   BUN 13 07/26/2007 0931   CREATININE 0.61 07/26/2007 0931   CALCIUM 8.6 07/26/2007 0931   GFRNONAA >60 07/26/2007 0931   GFRAA  07/26/2007 0931    >60        The eGFR has been calculated using the MDRD equation. This calculation has not been validated in all clinical   Review of her 2013 thyroid uptake and scan revealed a large cold nodule which was seen to arise from the inferior pole of the right lobe of the thyroid-reportedly biopsied with benign outcomes.  Cytology reports show that they were benign with some Hurthle cells.  Thyroid ultrasound from October 03, 2016 showed 4.4 cm right inferior pole solid thyroid nodule on the right lobe of the thyroid.  -This nodule appeared as a cold mass on the thyroid uptake and scan on October 26, 2017 which showed 43% uptake in 24 hours consistent with hyperthyroidism.  Fine-needle aspiration of dominant right-sided nodule of the thyroid on November 08, 2017 Diagnosis THYROID, FINE NEEDLE ASPIRATION, RIGHT (SPECIMEN 1 OF 1 COLLECTED 11/08/2017 CONSISTENT WITH BENIGN FOLLICULAR NODULE (BETHESDA CATEGORY II)  Recent Results (from the past 2160 hour(s))   T4, free     Status: None   Collection Time: 07/16/19 11:19 AM  Result Value Ref Range  Free T4 0.9 0.8 - 1.8 ng/dL  TSH     Status: Abnormal   Collection Time: 07/16/19 11:19 AM  Result Value Ref Range   TSH 0.03 (L) 0.40 - 4.50 mIU/L  T3, free     Status: None   Collection Time: 07/16/19 11:19 AM  Result Value Ref Range   T3, Free 3.6 2.3 - 4.2 pg/mL    Recent Results (from the past 2160 hour(s))  T4, free     Status: None   Collection Time: 07/16/19 11:19 AM  Result Value Ref Range   Free T4 0.9 0.8 - 1.8 ng/dL  TSH     Status: Abnormal   Collection Time: 07/16/19 11:19 AM  Result Value Ref Range   TSH 0.03 (L) 0.40 - 4.50 mIU/L  T3, free     Status: None   Collection Time: 07/16/19 11:19 AM  Result Value Ref Range   T3, Free 3.6 2.3 - 4.2 pg/mL  Free T4 1.6, total T4 8.2    Assessment & Plan:    1. Hyperthyroidism -She is status post I-131 therapy on 2 occasions, in February 2019 and June 2019.  Clinically, it appears that she is responding to treatment with thyroid hormone approaching normal range while she still has suppressed TSH.  She has received total of 64.9 mCi of I-131.  Her thyroid function tests prior to this visit indicate treatment effect, did not develop hypothyroidism yet.  She will not be initiated on thyroid hormone replacement.    2. Cold Nodule on right Lobe of Thyroid -negative biopsy for malignancy -She will be returning for follow-up visit in 6 months with repeat thyroid function tests.  If she continues to have clinically significant primary hyperthyroidism, she will be considered for retrial of methimazole, thyroidectomy if she does not have clinical response to methimazole.     Patient is aware of the fact that she will most likely require thyroid hormone replacement subsequent to ablative therapy.  She is advised to call clinic if she develops symptoms of relapse of thyrotoxicosis including rapid weight loss, palpitations, heat intolerance,  tremors; or rapid weight gain, constipation, cold intolerance.  - She wishes to follow up with her PCP for treatment of osteoporosis currently on prolia a status post 2 doses.  - I advised patient to maintain close follow up with Barry Dienes, NP for primary care needs.  Time for this visit: 15 minutes. Jody Howe  participated in the discussions, expressed understanding, and voiced agreement with the above plans.  All questions were answered to her satisfaction. she is encouraged to contact clinic should she have any questions or concerns prior to her return visit.  Follow up plan: Return in about 4 months (around 11/23/2019) for Follow up with Pre-visit Labs.   Glade Lloyd, MD [facility] [address] [place], [place] [address] Phone: [phone]  Fax: [phone]     07/23/2019, 5:37 PM  This note was partially dictated with voice recognition software. Similar sounding words can be transcribed inadequately or may not  be corrected upon review.

## 2019-11-26 ENCOUNTER — Ambulatory Visit: Payer: Medicare Other | Admitting: "Endocrinology

## 2019-11-29 LAB — T4, FREE: Free T4: 0.9 ng/dL (ref 0.8–1.8)

## 2019-11-29 LAB — T3, FREE: T3, Free: 3 pg/mL (ref 2.3–4.2)

## 2019-11-29 LAB — TSH: TSH: 3.22 mIU/L (ref 0.40–4.50)

## 2019-12-11 ENCOUNTER — Ambulatory Visit: Payer: Medicare PPO | Admitting: "Endocrinology

## 2019-12-11 ENCOUNTER — Encounter: Payer: Self-pay | Admitting: "Endocrinology

## 2019-12-11 ENCOUNTER — Other Ambulatory Visit: Payer: Self-pay

## 2019-12-11 VITALS — BP 124/71 | HR 66 | Ht 59.0 in | Wt 147.8 lb

## 2019-12-11 DIAGNOSIS — E890 Postprocedural hypothyroidism: Secondary | ICD-10-CM

## 2019-12-11 MED ORDER — LEVOTHYROXINE SODIUM 50 MCG PO TABS: 50.0000 ug | ORAL_TABLET | Freq: Every day | ORAL | 2 refills | Status: DC

## 2019-12-11 NOTE — Progress Notes (Signed)
12/11/2019, 5:28 PM                    Endocrinology follow-up note    Subjective:    Patient ID: RAIA AMICO, female    DOB: 1946/09/13, PCP Barry Dienes, NP   Past Medical History:  Diagnosis Date  . Allergic rhinitis   . Arthritis   . Constipation   . Cystocele    mild on exam  . Goiter   . Medical history non-contributory   . Osteoporosis   . Plantar fasciitis    Past Surgical History:  Procedure Laterality Date  . BASAL CELL CARCINOMA EXCISION    . CATARACT EXTRACTION    . COLONOSCOPY WITH PROPOFOL N/A 02/15/2016   Procedure: COLONOSCOPY WITH PROPOFOL;  Surgeon: Hulen Luster, MD;  Location: [facility] ENDOSCOPY;  Service: Gastroenterology;  Laterality: N/A;  . NOSE SURGERY     excision and flap - [facility] left nose  . RETINAL LASER PROCEDURE  10/08   laster retinal surgery  . TONSILLECTOMY     Social History   Socioeconomic History  . Marital status: Married    Spouse name: Not on file  . Number of children: Not on file  . Years of education: Not on file  . Highest education level: Not on file  Occupational History  . Not on file  Tobacco Use  . Smoking status: Never Smoker  . Smokeless tobacco: Never Used  Substance and Sexual Activity  . Alcohol use: No  . Drug use: No  . Sexual activity: Not on file  Other Topics Concern  . Not on file  Social History Narrative  . Not on file   Social Determinants of Health   Financial Resource Strain:   . Difficulty of Paying Living Expenses:   Food Insecurity:   . Worried About Charity fundraiser in the Last Year:   . Arboriculturist in the Last Year:   Transportation Needs:   . Film/video editor (Medical):   Marland Kitchen Lack of Transportation (Non-Medical):   Physical Activity:   . Days of Exercise per Week:   . Minutes of Exercise per Session:   Stress:   . Feeling of Stress :   Social Connections:   . Frequency of Communication with Friends and Family:   .  Frequency of Social Gatherings with Friends and Family:   . Attends Religious Services:   . Active Member of Clubs or Organizations:   . Attends Archivist Meetings:   Marland Kitchen Marital Status:    Outpatient Encounter Medications as of 12/11/2019  Medication Sig  . Ascorbic Acid (VITAMIN C) 500 MG CAPS Take 1 capsule by mouth 2 (two) times daily.  Marland Kitchen denosumab (PROLIA) 60 MG/ML SOLN injection Inject 60 mg into the skin every 6 (six) months. Administer in upper arm, thigh, or abdomen  . docusate sodium (COLACE) 100 MG capsule Take 1 capsule by mouth 2 (two) times daily.  . Glucosamine-Chondroitin 500-400 MG CAPS Take by mouth 2 (two) times daily.  Marland Kitchen levothyroxine (SYNTHROID) 50 MCG tablet Take 1 tablet (50 mcg total) by mouth daily before breakfast.  . Magnesium 250 MG TABS Take by mouth daily.  Marland Kitchen  Psyllium Husk POWD Take by mouth.  . Vitamin D, Ergocalciferol, (DRISDOL) 1.25 MG (50000 UNIT) CAPS capsule Take 1 capsule by mouth once a week.   No facility-administered encounter medications on file as of 12/11/2019.   ALLERGIES: Allergies  Allergen Reactions  . Ceftin [Cefuroxime Axetil] Other (See Comments)  . Other Hives  . Sulfa Antibiotics Hives    VACCINATION STATUS: Immunization History  Administered Date(s) Administered  . Influenza-Unspecified 07/09/2014    HPI SHANIE MAUZY is 74 y.o. female who presents today with a medical history as above. 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    Her treatments were administered in February 2019 and June 2019. She is returning with recent stent and repeat thyroid function test which is consistent with treatment effect. -She is gaining weight, denies palpitations, tremors, heat intolerance.  She noticed that she is requiring more jackets over the last several weeks.  -She is not currently on any antithyroid medications nor  thyroid hormone supplements.  She continues to feel better, does not have new complaints today.    she denies family history of thyroid dysfunction. No family history of thyroid cancer. - Around 2013, she was found to have cold nodule on her right thyroid which reportedly was biopsied and showed benign findings.   - Her repeat thyroid ultrasound shows 4.4 cm nodule in the general area of the previously biopsied nodule on the right lobe of her thyroid.  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  - She has history of osteoporosis on prolia treatment.  Review of Systems Limited as above.  Objective:    BP 124/71   Pulse 66   Ht '4\' 11"'$  (1.499 m)   Wt 147 lb 12.8 oz (67 kg)   BMI 29.85 kg/m   Wt Readings from Last 3 Encounters:  12/11/19 147 lb 12.8 oz (67 kg)  10/19/18 136 lb (61.7 kg)  07/16/18 127 lb (57.6 kg)    Physical Exam    Physical Exam- Limited  Constitutional:  Body mass index is 29.85 kg/m. , not in acute distress, normal state of mind Eyes:  EOMI, no exophthalmos Neck: Supple Thyroid: No gross goiter Respiratory: Adequate breathing efforts Musculoskeletal: no gross deformities, strength intact in all four extremities, no gross restriction of joint movements Skin:  no rashes, no hyperemia Neurological: no tremor with outstretched hands,   CMP     Component Value Date/Time   NA 138 07/26/2007 0931   K 3.8 07/26/2007 0931   CL 106 07/26/2007 0931   CO2 25 07/26/2007 0931   GLUCOSE 92 07/26/2007 0931   BUN 13 07/26/2007 0931   CREATININE 0.61 07/26/2007 0931   CALCIUM 8.6 07/26/2007 0931   GFRNONAA >60 07/26/2007 0931   GFRAA  07/26/2007 0931    >60        The eGFR has been calculated using the MDRD equation. This calculation has not been validated in all clinical   Review of her 2013 thyroid uptake and scan revealed a large cold nodule which was seen to arise from the inferior pole of the right lobe of the  thyroid-reportedly biopsied with benign outcomes.  Cytology reports show that they were benign with some Hurthle cells.  Thyroid ultrasound from October 03, 2016 showed 4.4 cm right inferior pole solid thyroid nodule on the right lobe of the thyroid.  -This nodule appeared as a cold mass on  the thyroid uptake and scan on October 26, 2017 which showed 43% uptake in 24 hours consistent with hyperthyroidism.  Fine-needle aspiration of dominant right-sided nodule of the thyroid on November 08, 2017 Diagnosis THYROID, FINE NEEDLE ASPIRATION, RIGHT (SPECIMEN 1 OF 1 COLLECTED 11/08/2017 CONSISTENT WITH BENIGN FOLLICULAR NODULE (BETHESDA CATEGORY II)  Recent Results (from the past 2160 hour(s))  TSH     Status: None   Collection Time: 11/29/19  9:18 AM  Result Value Ref Range   TSH 3.22 0.40 - 4.50 mIU/L  T4, free     Status: None   Collection Time: 11/29/19  9:18 AM  Result Value Ref Range   Free T4 0.9 0.8 - 1.8 ng/dL  T3, free     Status: None   Collection Time: 11/29/19  9:18 AM  Result Value Ref Range   T3, Free 3.0 2.3 - 4.2 pg/mL    Recent Results (from the past 2160 hour(s))  TSH     Status: None   Collection Time: 11/29/19  9:18 AM  Result Value Ref Range   TSH 3.22 0.40 - 4.50 mIU/L  T4, free     Status: None   Collection Time: 11/29/19  9:18 AM  Result Value Ref Range   Free T4 0.9 0.8 - 1.8 ng/dL  T3, free     Status: None   Collection Time: 11/29/19  9:18 AM  Result Value Ref Range   T3, Free 3.0 2.3 - 4.2 pg/mL  Free T4 1.6, total T4 8.2    Assessment & Plan:    1.  RAI induced hypothyroidism  -She presents with thyroid function test consistent with treatment effect.  She will benefit from early initiation of thyroid hormone supplement. I discussed and initiated levothyroxine 50 mcg p.o. daily before breakfast.   - We discussed about the correct intake of her thyroid hormone, on empty stomach at fasting, with water, separated by at least 30 minutes from breakfast  and other medications,  and separated by more than 4 hours from calcium, iron, multivitamins, acid reflux medications (PPIs). -Patient is made aware of the fact that thyroid hormone replacement is needed for life, dose to be adjusted by periodic monitoring of thyroid function tests.     2. Cold Nodule on right Lobe of Thyroid -negative biopsy for malignancy -She will be considered for repeat thyroid/neck ultrasound in 1 year.    - She wishes to follow up with her PCP for treatment of osteoporosis currently on prolia a status post 2 doses.  - I advised patient to maintain close follow up with Barry Dienes, NP for primary care needs.    - Time spent on this patient care encounter:  25 minutes of which 50% was spent in  counseling and the rest reviewing  her current and  previous labs / studies and medications  doses and developing a plan for long term care. Cornell Barman  participated in the discussions, expressed understanding, and voiced agreement with the above plans.  All questions were answered to her satisfaction. she is encouraged to contact clinic should she have any questions or concerns prior to her return visit.   Follow up plan: Return in about 10 weeks (around 02/19/2020) for Follow up with Pre-visit Labs.   Glade Lloyd, MD [facility] [address] [place], [place] [address] Phone: [phone]  Fax: [phone]     12/11/2019, 5:28 PM  This note was partially dictated with voice recognition software. Similar  sounding words can be transcribed inadequately or may not  be corrected upon review.

## 2020-02-15 LAB — TSH: TSH: 0.02 mIU/L — ABNORMAL LOW (ref 0.40–4.50)

## 2020-02-15 LAB — T4, FREE: Free T4: 1.4 ng/dL (ref 0.8–1.8)

## 2020-02-21 ENCOUNTER — Other Ambulatory Visit: Payer: Self-pay

## 2020-02-21 ENCOUNTER — Encounter: Payer: Self-pay | Admitting: "Endocrinology

## 2020-02-21 ENCOUNTER — Ambulatory Visit (INDEPENDENT_AMBULATORY_CARE_PROVIDER_SITE_OTHER): Payer: Medicare PPO | Admitting: "Endocrinology

## 2020-02-21 VITALS — BP 106/67 | HR 60 | Ht 59.0 in | Wt 143.8 lb

## 2020-02-21 DIAGNOSIS — E890 Postprocedural hypothyroidism: Secondary | ICD-10-CM | POA: Diagnosis not present

## 2020-02-21 NOTE — Progress Notes (Signed)
02/21/2020, 12:22 PM                    Endocrinology follow-up note    Subjective:    Patient ID: SINAI ILLINGWORTH, female    DOB: Jul 23, 1946, PCP Barry Dienes, NP   Past Medical History:  Diagnosis Date  . Allergic rhinitis   . Arthritis   . Constipation   . Cystocele    mild on exam  . Goiter   . Medical history non-contributory   . Osteoporosis   . Plantar fasciitis    Past Surgical History:  Procedure Laterality Date  . BASAL CELL CARCINOMA EXCISION    . CATARACT EXTRACTION    . COLONOSCOPY WITH PROPOFOL N/A 02/15/2016   Procedure: COLONOSCOPY WITH PROPOFOL;  Surgeon: Hulen Luster, MD;  Location: [facility] ENDOSCOPY;  Service: Gastroenterology;  Laterality: N/A;  . NOSE SURGERY     excision and flap - [facility] left nose  . RETINAL LASER PROCEDURE  10/08   laster retinal surgery  . TONSILLECTOMY     Social History   Socioeconomic History  . Marital status: Married    Spouse name: Not on file  . Number of children: Not on file  . Years of education: Not on file  . Highest education level: Not on file  Occupational History  . Not on file  Tobacco Use  . Smoking status: Never Smoker  . Smokeless tobacco: Never Used  Substance and Sexual Activity  . Alcohol use: No  . Drug use: No  . Sexual activity: Not on file  Other Topics Concern  . Not on file  Social History Narrative  . Not on file   Social Determinants of Health   Financial Resource Strain:   . Difficulty of Paying Living Expenses:   Food Insecurity:   . Worried About Charity fundraiser in the Last Year:   . Arboriculturist in the Last Year:   Transportation Needs:   . Film/video editor (Medical):   Marland Kitchen Lack of Transportation (Non-Medical):   Physical Activity:   . Days of Exercise per Week:   . Minutes of Exercise per Session:   Stress:   . Feeling of Stress :   Social Connections:   . Frequency of Communication with Friends and Family:   .  Frequency of Social Gatherings with Friends and Family:   . Attends Religious Services:   . Active Member of Clubs or Organizations:   . Attends Archivist Meetings:   Marland Kitchen Marital Status:    Outpatient Encounter Medications as of 02/21/2020  Medication Sig  . Ascorbic Acid (VITAMIN C) 500 MG CAPS Take 1 capsule by mouth 2 (two) times daily.  Marland Kitchen denosumab (PROLIA) 60 MG/ML SOLN injection Inject 60 mg into the skin every 6 (six) months. Administer in upper arm, thigh, or abdomen  . docusate sodium (COLACE) 100 MG capsule Take 1 capsule by mouth 2 (two) times daily.  . Glucosamine-Chondroitin 500-400 MG CAPS Take by mouth 2 (two) times daily.  . Magnesium 250 MG TABS Take by mouth daily.  . Psyllium Husk POWD Take by mouth.  . [DISCONTINUED] levothyroxine (SYNTHROID) 50 MCG tablet Take 1 tablet (50  mcg total) by mouth daily before breakfast.  . [DISCONTINUED] Vitamin D, Ergocalciferol, (DRISDOL) 1.25 MG (50000 UNIT) CAPS capsule Take 1 capsule by mouth once a week.   No facility-administered encounter medications on file as of 02/21/2020.   ALLERGIES: Allergies  Allergen Reactions  . Ceftin [Cefuroxime Axetil] Other (See Comments)  . Other Hives  . Sulfa Antibiotics Hives    VACCINATION STATUS: Immunization History  Administered Date(s) Administered  . Influenza-Unspecified 07/09/2014    HPI Jody Howe is 74 y.o. female who presents today with a medical history as above. 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    Her treatments were administered in February 2019 and June 2019. She is returning with recent stent and repeat thyroid function test which is consistent with treatment effect. With evidence of treatment effect, during her last visit she was started on low-dose levothyroxine.  However, patient started to have her previous symptoms including palpitations,  tremors, anxiety.  Her previsit labs show evidence of overtreatment.    she denies family history of thyroid dysfunction. No family history of thyroid cancer. - Around 2013, she was found to have cold nodule on her right thyroid which reportedly was biopsied and showed benign findings.   - Her repeat thyroid ultrasound shows 4.4 cm nodule in the general area of the previously biopsied nodule on the right lobe of her thyroid.  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  - She has history of osteoporosis on prolia treatment.  Review of Systems Limited as above.  Objective:    BP 106/67   Pulse 60   Ht '4\' 11"'$  (1.499 m)   Wt 143 lb 12.8 oz (65.2 kg)   BMI 29.04 kg/m   Wt Readings from Last 3 Encounters:  02/21/20 143 lb 12.8 oz (65.2 kg)  12/11/19 147 lb 12.8 oz (67 kg)  10/19/18 136 lb (61.7 kg)    Physical Exam  Constitutional:  Body mass index is 29.04 kg/m. , not in acute distress, normal state of mind Eyes:  EOMI, no exophthalmos Neck: Supple Thyroid:  + Gross nodular goiter confirmed by previous ultrasound.   Respiratory: Adequate breathing efforts Musculoskeletal: no gross deformities, strength intact in all four extremities, no gross restriction of joint movements Skin:  no rashes, no hyperemia Neurological:  +mild  tremor with outstretched hands,    CMP     Component Value Date/Time   NA 138 07/26/2007 0931   K 3.8 07/26/2007 0931   CL 106 07/26/2007 0931   CO2 25 07/26/2007 0931   GLUCOSE 92 07/26/2007 0931   BUN 13 07/26/2007 0931   CREATININE 0.61 07/26/2007 0931   CALCIUM 8.6 07/26/2007 0931   GFRNONAA >60 07/26/2007 0931   GFRAA  07/26/2007 0931    >60        The eGFR has been calculated using the MDRD equation. This calculation has not been validated in all clinical   Review of her 2013 thyroid uptake and scan revealed a large cold nodule which was seen to arise from the inferior pole of the right lobe of  the thyroid-reportedly biopsied with benign outcomes.  Cytology reports show that they were benign with some Hurthle cells.  Thyroid ultrasound from October 03, 2016 showed 4.4 cm right inferior pole solid thyroid nodule on the right lobe of the thyroid.  -This nodule appeared as a cold mass on the thyroid uptake  and scan on October 26, 2017 which showed 43% uptake in 24 hours consistent with hyperthyroidism.  Fine-needle aspiration of dominant right-sided nodule of the thyroid on November 08, 2017 Diagnosis THYROID, FINE NEEDLE ASPIRATION, RIGHT (SPECIMEN 1 OF 1 COLLECTED 11/08/2017 CONSISTENT WITH BENIGN FOLLICULAR NODULE (BETHESDA CATEGORY II)  Recent Results (from the past 2160 hour(s))  TSH     Status: None   Collection Time: 11/29/19  9:18 AM  Result Value Ref Range   TSH 3.22 0.40 - 4.50 mIU/L  T4, free     Status: None   Collection Time: 11/29/19  9:18 AM  Result Value Ref Range   Free T4 0.9 0.8 - 1.8 ng/dL  T3, free     Status: None   Collection Time: 11/29/19  9:18 AM  Result Value Ref Range   T3, Free 3.0 2.3 - 4.2 pg/mL  TSH     Status: Abnormal   Collection Time: 02/14/20 10:53 AM  Result Value Ref Range   TSH 0.02 (L) 0.40 - 4.50 mIU/L  T4, free     Status: None   Collection Time: 02/14/20 10:53 AM  Result Value Ref Range   Free T4 1.4 0.8 - 1.8 ng/dL    Recent Results (from the past 2160 hour(s))  TSH     Status: None   Collection Time: 11/29/19  9:18 AM  Result Value Ref Range   TSH 3.22 0.40 - 4.50 mIU/L  T4, free     Status: None   Collection Time: 11/29/19  9:18 AM  Result Value Ref Range   Free T4 0.9 0.8 - 1.8 ng/dL  T3, free     Status: None   Collection Time: 11/29/19  9:18 AM  Result Value Ref Range   T3, Free 3.0 2.3 - 4.2 pg/mL  TSH     Status: Abnormal   Collection Time: 02/14/20 10:53 AM  Result Value Ref Range   TSH 0.02 (L) 0.40 - 4.50 mIU/L  T4, free     Status: None   Collection Time: 02/14/20 10:53 AM  Result Value Ref Range   Free  T4 1.4 0.8 - 1.8 ng/dL  Free T4 1.6, total T4 8.2    Assessment & Plan:    1.  RAI induced hypothyroidism with iatrogenic thyrotoxicosis. -It is possible that she has treatment failure given after to rounds of radioactive iodine ablation.   -For now, she is advised to discontinue levothyroxine.  She will have repeat thyroid function test in 6 weeks. -If she returns with persistent or relapsed primary hyperthyroidism, she will be approached for surgical treatment with total thyroidectomy. -This option was briefly discussed with her today.  2. Cold Nodule on right Lobe of Thyroid -negative biopsy for malignancy -She will be considered for repeat thyroid/neck ultrasound in 1 year.    - She wishes to follow up with her PCP for treatment of osteoporosis currently on prolia a status post 2 doses.  - I advised patient to maintain close follow up with Barry Dienes, NP for primary care needs.     - Time spent on this patient care encounter:  20 minutes of which 50% was spent in  counseling and the rest reviewing  her current and  previous labs / studies and medications  doses and developing a plan for long term care. Cornell Barman  participated in the discussions, expressed understanding, and voiced agreement with the above plans.  All questions were answered to her satisfaction. she is encouraged to  contact clinic should she have any questions or concerns prior to her return visit.   Follow up plan: Return in about 6 weeks (around 04/03/2020) for F/U with Pre-visit Labs.   Glade Lloyd, MD [facility] [address] [place], [place] [address] Phone: [phone]  Fax: [phone]     02/21/2020, 12:22 PM  This note was partially dictated with voice recognition software. Similar sounding words can be transcribed inadequately or may not  be corrected upon review.

## 2020-04-03 LAB — T3, FREE: T3, Free: 2.7 pg/mL (ref 2.3–4.2)

## 2020-04-03 LAB — T4, FREE: Free T4: 0.8 ng/dL (ref 0.8–1.8)

## 2020-04-03 LAB — TSH: TSH: 7.52 mIU/L — ABNORMAL HIGH (ref 0.40–4.50)

## 2020-04-08 ENCOUNTER — Other Ambulatory Visit (HOSPITAL_COMMUNITY): Payer: Self-pay | Admitting: Family Medicine

## 2020-04-08 DIAGNOSIS — Z1231 Encounter for screening mammogram for malignant neoplasm of breast: Secondary | ICD-10-CM

## 2020-04-10 ENCOUNTER — Encounter: Payer: Self-pay | Admitting: "Endocrinology

## 2020-04-10 ENCOUNTER — Other Ambulatory Visit: Payer: Self-pay

## 2020-04-10 ENCOUNTER — Ambulatory Visit: Payer: Medicare PPO | Admitting: "Endocrinology

## 2020-04-10 VITALS — BP 120/73 | HR 55 | Ht 59.0 in | Wt 145.2 lb

## 2020-04-10 DIAGNOSIS — E890 Postprocedural hypothyroidism: Secondary | ICD-10-CM | POA: Diagnosis not present

## 2020-04-10 MED ORDER — LEVOTHYROXINE SODIUM 25 MCG PO TABS: 25.0000 ug | ORAL_TABLET | Freq: Every day | ORAL | 2 refills | Status: DC

## 2020-04-10 NOTE — Progress Notes (Signed)
04/10/2020, 11:37 AM                    Endocrinology follow-up note    Subjective:    Patient ID: Jody Howe, female    DOB: July 09, 1946, PCP Antionette Char, MD   Past Medical History:  Diagnosis Date  . Allergic rhinitis   . Arthritis   . Constipation   . Cystocele    mild on exam  . Goiter   . Medical history non-contributory   . Osteoporosis   . Plantar fasciitis    Past Surgical History:  Procedure Laterality Date  . BASAL CELL CARCINOMA EXCISION    . CATARACT EXTRACTION    . COLONOSCOPY WITH PROPOFOL N/A 02/15/2016   Procedure: COLONOSCOPY WITH PROPOFOL;  Surgeon: Hulen Luster, MD;  Location: [facility] ENDOSCOPY;  Service: Gastroenterology;  Laterality: N/A;  . NOSE SURGERY     excision and flap - [facility] left nose  . RETINAL LASER PROCEDURE  10/08   laster retinal surgery  . TONSILLECTOMY     Social History   Socioeconomic History  . Marital status: Married    Spouse name: Not on file  . Number of children: Not on file  . Years of education: Not on file  . Highest education level: Not on file  Occupational History  . Not on file  Tobacco Use  . Smoking status: Never Smoker  . Smokeless tobacco: Never Used  Vaping Use  . Vaping Use: Never used  Substance and Sexual Activity  . Alcohol use: No  . Drug use: No  . Sexual activity: Not on file  Other Topics Concern  . Not on file  Social History Narrative  . Not on file   Social Determinants of Health   Financial Resource Strain:   . Difficulty of Paying Living Expenses:   Food Insecurity:   . Worried About Charity fundraiser in the Last Year:   . Arboriculturist in the Last Year:   Transportation Needs:   . Film/video editor (Medical):   Marland Kitchen Lack of Transportation (Non-Medical):   Physical Activity:   . Days of Exercise per Week:   . Minutes of Exercise per Session:   Stress:   . Feeling of Stress :   Social Connections:   . Frequency of  Communication with Friends and Family:   . Frequency of Social Gatherings with Friends and Family:   . Attends Religious Services:   . Active Member of Clubs or Organizations:   . Attends Archivist Meetings:   Marland Kitchen Marital Status:    Outpatient Encounter Medications as of 04/10/2020  Medication Sig  . Calcium Carb-Cholecalciferol (CALCIUM 600+D) 600-800 MG-UNIT TABS Take 1 tablet by mouth 2 (two) times daily.  . Cholecalciferol (VITAMIN D3) 125 MCG (5000 UT) CAPS Take 1 capsule by mouth daily.  . Glucosamine-Chondroitin (GLUCOSAMINE CHONDR COMPLEX PO) Take 1 tablet by mouth 2 (two) times daily.  . Omega-3 Fatty Acids (FISH OIL) 1200 MG CAPS Take 1 capsule by mouth 2 (two) times daily.  . Ascorbic Acid (VITAMIN C) 500 MG CAPS Take 1 capsule by mouth 2 (two) times daily.  Marland Kitchen denosumab (PROLIA) 60 MG/ML  SOLN injection Inject 60 mg into the skin every 6 (six) months. Administer in upper arm, thigh, or abdomen  . docusate sodium (COLACE) 100 MG capsule Take 1 capsule by mouth 2 (two) times daily.  Marland Kitchen levothyroxine (SYNTHROID) 25 MCG tablet Take 1 tablet (25 mcg total) by mouth daily before breakfast.  . Magnesium 250 MG TABS Take by mouth daily.  . Psyllium Husk POWD Take by mouth.  . [DISCONTINUED] Glucosamine-Chondroitin 500-400 MG CAPS Take by mouth 2 (two) times daily.   No facility-administered encounter medications on file as of 04/10/2020.   ALLERGIES: Allergies  Allergen Reactions  . Ceftin [Cefuroxime Axetil] Other (See Comments)  . Other Hives  . Sulfa Antibiotics Hives    VACCINATION STATUS: Immunization History  Administered Date(s) Administered  . Influenza-Unspecified 07/09/2014    HPI Jody Howe is 74 y.o. female who presents today with a medical history as above. 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    Her treatments were administered in February  2019 and June 2019. She is returning with recent stent and repeat thyroid function test which is consistent with treatment effect. 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  She returns with repeat thyroid function test consistent with treatment effect with clinical hypothyroidism. -She has no new complaints today.   she denies family history of thyroid dysfunction. No family history of thyroid cancer. - Around 2013, she was found to have cold nodule on her right thyroid which reportedly was biopsied and showed benign findings.   - Her repeat thyroid ultrasound shows 4.4 cm nodule in the general area of the previously biopsied nodule on the right lobe of her thyroid.  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  - She has history of osteoporosis on prolia treatment.  Review of Systems Limited as above.  Objective:    BP 120/73   Pulse (!) 55   Ht '4\' 11"'$  (1.499 m)   Wt 145 lb 3.2 oz (65.9 kg)   BMI 29.33 kg/m   Wt Readings from Last 3 Encounters:  04/10/20 145 lb 3.2 oz (65.9 kg)  02/21/20 143 lb 12.8 oz (65.2 kg)  12/11/19 147 lb 12.8 oz (67 kg)    Physical Exam  Constitutional:  Body mass index is 29.33 kg/m. , not in acute distress, normal state of mind Eyes:  EOMI, no exophthalmos Neck: Supple Thyroid:  + Gross nodular goiter confirmed by previous ultrasound.   Respiratory: Adequate breathing efforts Musculoskeletal: no gross deformities, strength intact in all four extremities, no gross restriction of joint movements Skin:  no rashes, no hyperemia Neurological:  +mild  tremor with outstretched hands,    CMP     Component Value Date/Time   NA 138 07/26/2007 0931   K 3.8 07/26/2007 0931   CL 106 07/26/2007 0931   CO2 25 07/26/2007 0931   GLUCOSE 92 07/26/2007 0931   BUN 13 07/26/2007 0931   CREATININE 0.61 07/26/2007 0931   CALCIUM 8.6  07/26/2007 0931   GFRNONAA >60 07/26/2007 0931   GFRAA  07/26/2007 0931    >60        The eGFR has been calculated using the MDRD equation. This calculation has not been validated in all clinical   Review of her 2013 thyroid uptake and scan revealed a large cold nodule which was seen to arise  from the inferior pole of the right lobe of the thyroid-reportedly biopsied with benign outcomes.  Cytology reports show that they were benign with some Hurthle cells.  Thyroid ultrasound from October 03, 2016 showed 4.4 cm right inferior pole solid thyroid nodule on the right lobe of the thyroid.  -This nodule appeared as a cold mass on the thyroid uptake and scan on October 26, 2017 which showed 43% uptake in 24 hours consistent with hyperthyroidism.  Fine-needle aspiration of dominant right-sided nodule of the thyroid on November 08, 2017 Diagnosis THYROID, FINE NEEDLE ASPIRATION, RIGHT (SPECIMEN 1 OF 1 COLLECTED 11/08/2017 CONSISTENT WITH BENIGN FOLLICULAR NODULE (BETHESDA CATEGORY II)  Recent Results (from the past 2160 hour(s))  TSH     Status: Abnormal   Collection Time: 02/14/20 10:53 AM  Result Value Ref Range   TSH 0.02 (L) 0.40 - 4.50 mIU/L  T4, free     Status: None   Collection Time: 02/14/20 10:53 AM  Result Value Ref Range   Free T4 1.4 0.8 - 1.8 ng/dL  TSH     Status: Abnormal   Collection Time: 04/03/20  9:16 AM  Result Value Ref Range   TSH 7.52 (H) 0.40 - 4.50 mIU/L  T4, free     Status: None   Collection Time: 04/03/20  9:16 AM  Result Value Ref Range   Free T4 0.8 0.8 - 1.8 ng/dL  T3, free     Status: None   Collection Time: 04/03/20  9:16 AM  Result Value Ref Range   T3, Free 2.7 2.3 - 4.2 pg/mL    Recent Results (from the past 2160 hour(s))  TSH     Status: Abnormal   Collection Time: 02/14/20 10:53 AM  Result Value Ref Range   TSH 0.02 (L) 0.40 - 4.50 mIU/L  T4, free     Status: None   Collection Time: 02/14/20 10:53 AM  Result Value Ref Range   Free T4  1.4 0.8 - 1.8 ng/dL  TSH     Status: Abnormal   Collection Time: 04/03/20  9:16 AM  Result Value Ref Range   TSH 7.52 (H) 0.40 - 4.50 mIU/L  T4, free     Status: None   Collection Time: 04/03/20  9:16 AM  Result Value Ref Range   Free T4 0.8 0.8 - 1.8 ng/dL  T3, free     Status: None   Collection Time: 04/03/20  9:16 AM  Result Value Ref Range   T3, Free 2.7 2.3 - 4.2 pg/mL  Free T4 1.6, total T4 8.2    Assessment & Plan:    1.  RAI induced hypothyroidism  -Her presentation now is consistent with treatment effect with clinical hypothyroidism. -She will be initiated on thyroid hormone replacement.  This hormone replacement will be started slowly given her recent iatrogenic thyrotoxicosis.  I discussed and started levothyroxine 25 mcg p.o. daily before breakfast with plan to repeat thyroid function test in 10 weeks.  - We discussed about the correct intake of her thyroid hormone, on empty stomach at fasting, with water, separated by at least 30 minutes from breakfast and other medications,  and separated by more than 4 hours from calcium, iron, multivitamins, acid reflux medications (PPIs). -Patient is made aware of the fact that thyroid hormone replacement is needed for life, dose to be adjusted by periodic monitoring of thyroid function tests.   2. Cold Nodule on right Lobe of Thyroid -negative biopsy for malignancy -She will be considered for  repeat thyroid/neck ultrasound in 1 year.    - She wishes to follow up with her PCP for treatment of osteoporosis currently on prolia a status post 2 doses.  - I advised patient to maintain close follow up with Antionette Char, MD for primary care needs.     - Time spent on this patient care encounter:  20 minutes of which 50% was spent in  counseling and the rest reviewing  her current and  previous labs / studies and medications  doses and developing a plan for long term care. Cornell Barman  participated in the discussions, expressed  understanding, and voiced agreement with the above plans.  All questions were answered to her satisfaction. she is encouraged to contact clinic should she have any questions or concerns prior to her return visit.    Follow up plan: Return in about 10 weeks (around 06/19/2020) for F/U with Pre-visit Labs.   Glade Lloyd, MD [facility] [address] [place], [place] [address] Phone: [phone]  Fax: [phone]     04/10/2020, 11:37 AM  This note was partially dictated with voice recognition software. Similar sounding words can be transcribed inadequately or may not  be corrected upon review.

## 2020-05-13 ENCOUNTER — Other Ambulatory Visit: Payer: Self-pay

## 2020-05-13 ENCOUNTER — Ambulatory Visit (HOSPITAL_COMMUNITY)
Admission: RE | Admit: 2020-05-13 | Discharge: 2020-05-13 | Disposition: A | Discharge status: Home / Self Care | Payer: Medicare PPO | Source: Ambulatory Visit | Attending: Family Medicine | Admitting: Family Medicine

## 2020-05-13 DIAGNOSIS — Z1231 Encounter for screening mammogram for malignant neoplasm of breast: Secondary | ICD-10-CM | POA: Insufficient documentation

## 2020-06-17 LAB — TSH: TSH: 3.49 mIU/L (ref 0.40–4.50)

## 2020-06-17 LAB — T4, FREE: Free T4: 1 ng/dL (ref 0.8–1.8)

## 2020-06-23 ENCOUNTER — Ambulatory Visit: Payer: Medicare PPO | Admitting: "Endocrinology

## 2020-06-23 ENCOUNTER — Encounter: Payer: Self-pay | Admitting: "Endocrinology

## 2020-06-23 ENCOUNTER — Other Ambulatory Visit: Payer: Self-pay

## 2020-06-23 VITALS — BP 116/78 | HR 72 | Ht 59.0 in | Wt 149.4 lb

## 2020-06-23 DIAGNOSIS — E890 Postprocedural hypothyroidism: Secondary | ICD-10-CM

## 2020-06-23 MED ORDER — LEVOTHYROXINE SODIUM 50 MCG PO TABS: 50.0000 ug | ORAL_TABLET | Freq: Every day | ORAL | 3 refills | Status: DC

## 2020-06-23 NOTE — Progress Notes (Signed)
06/23/2020, 12:54 PM                    Endocrinology follow-up note    Subjective:    Patient ID: Jody Howe, female    DOB: Mar 23, 1946, PCP Antionette Char, MD   Past Medical History:  Diagnosis Date  . Allergic rhinitis   . Arthritis   . Constipation   . Cystocele    mild on exam  . Goiter   . Medical history non-contributory   . Osteoporosis   . Plantar fasciitis    Past Surgical History:  Procedure Laterality Date  . BASAL CELL CARCINOMA EXCISION    . CATARACT EXTRACTION    . COLONOSCOPY WITH PROPOFOL N/A 02/15/2016   Procedure: COLONOSCOPY WITH PROPOFOL;  Surgeon: Hulen Luster, MD;  Location: [facility] ENDOSCOPY;  Service: Gastroenterology;  Laterality: N/A;  . NOSE SURGERY     excision and flap - [facility] left nose  . RETINAL LASER PROCEDURE  10/08   laster retinal surgery  . TONSILLECTOMY     Social History   Socioeconomic History  . Marital status: Married    Spouse name: Not on file  . Number of children: Not on file  . Years of education: Not on file  . Highest education level: Not on file  Occupational History  . Not on file  Tobacco Use  . Smoking status: Never Smoker  . Smokeless tobacco: Never Used  Vaping Use  . Vaping Use: Never used  Substance and Sexual Activity  . Alcohol use: No  . Drug use: No  . Sexual activity: Not on file  Other Topics Concern  . Not on file  Social History Narrative  . Not on file   Social Determinants of Health   Financial Resource Strain:   . Difficulty of Paying Living Expenses: Not on file  Food Insecurity:   . Worried About Charity fundraiser in the Last Year: Not on file  . Ran Out of Food in the Last Year: Not on file  Transportation Needs:   . Lack of Transportation (Medical): Not on file  . Lack of Transportation (Non-Medical): Not on file  Physical Activity:   . Days of Exercise per Week: Not on file  . Minutes of Exercise per Session: Not on file   Stress:   . Feeling of Stress : Not on file  Social Connections:   . Frequency of Communication with Friends and Family: Not on file  . Frequency of Social Gatherings with Friends and Family: Not on file  . Attends Religious Services: Not on file  . Active Member of Clubs or Organizations: Not on file  . Attends Archivist Meetings: Not on file  . Marital Status: Not on file   Outpatient Encounter Medications as of 06/23/2020  Medication Sig  . Ascorbic Acid (VITAMIN C) 500 MG CAPS Take 1 capsule by mouth 2 (two) times daily.  . Calcium Carb-Cholecalciferol (CALCIUM 600+D) 600-800 MG-UNIT TABS Take 1 tablet by mouth 2 (two) times daily.  . Cholecalciferol (VITAMIN D3) 125 MCG (5000 UT) CAPS Take 1 capsule by mouth daily.  Marland Kitchen denosumab (PROLIA) 60 MG/ML SOLN injection Inject 60 mg into the  skin every 6 (six) months. Administer in upper arm, thigh, or abdomen  . docusate sodium (COLACE) 100 MG capsule Take 1 capsule by mouth 2 (two) times daily.  . Glucosamine-Chondroitin (GLUCOSAMINE CHONDR COMPLEX PO) Take 1 tablet by mouth 2 (two) times daily.  Marland Kitchen levothyroxine (SYNTHROID) 50 MCG tablet Take 1 tablet (50 mcg total) by mouth daily before breakfast.  . Magnesium 250 MG TABS Take by mouth daily.  . Omega-3 Fatty Acids (FISH OIL) 1200 MG CAPS Take 1 capsule by mouth 2 (two) times daily.  . Psyllium Husk POWD Take by mouth.  . [DISCONTINUED] levothyroxine (SYNTHROID) 25 MCG tablet Take 1 tablet (25 mcg total) by mouth daily before breakfast.   No facility-administered encounter medications on file as of 06/23/2020.   ALLERGIES: Allergies  Allergen Reactions  . Ceftin [Cefuroxime Axetil] Other (See Comments)  . Other Hives  . Sulfa Antibiotics Hives    VACCINATION STATUS: Immunization History  Administered Date(s) Administered  . Influenza-Unspecified 07/09/2014    HPI Jody Howe is 74 y.o. female who presents today with a medical history as above. 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    Her treatments were administered in February 2019 and June 2019.   -During her last visit, she was started on levothyroxine 25 mcg p.o. daily before breakfast.  She did not tolerate 50 mcg in the prior visit.  She returns with repeat thyroid function tests.  She complains of inability to lose weight.  she denies family history of thyroid dysfunction. No family history of thyroid cancer. - Around 2013, she was found to have cold nodule on her right thyroid which reportedly was biopsied and showed benign findings.   - Her repeat thyroid ultrasound shows 4.4 cm nodule in the general area of the previously biopsied nodule on the right lobe of her thyroid.  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  - She has history of osteoporosis on prolia treatment.  Review of Systems Limited as above.  Objective:    BP 116/78   Pulse 72   Ht '4\' 11"'  (1.499 m)   Wt 149 lb 6.4 oz (67.8 kg)   BMI 30.18 kg/m   Wt Readings from Last 3 Encounters:  06/23/20 149 lb 6.4 oz (67.8 kg)  04/10/20 145 lb 3.2 oz (65.9 kg)  02/21/20 143 lb 12.8 oz (65.2 kg)    Physical Exam  Constitutional:  Body mass index is 30.18 kg/m. , not in acute distress, normal state of mind Eyes:  EOMI, no exophthalmos Neck: Supple Thyroid:  + Gross nodular goiter confirmed by previous ultrasound.   Respiratory: Adequate breathing efforts Musculoskeletal: no gross deformities, strength intact in all four extremities, no gross restriction of joint movements Skin:  no rashes, no hyperemia Neurological:  +mild  tremor with outstretched hands,    CMP     Component Value Date/Time   NA 138 07/26/2007 0931   K 3.8 07/26/2007 0931   CL 106 07/26/2007 0931   CO2 25 07/26/2007 0931   GLUCOSE 92 07/26/2007 0931   BUN 13 07/26/2007 0931   CREATININE 0.61  07/26/2007 0931   CALCIUM 8.6 07/26/2007 0931   GFRNONAA >60 07/26/2007 0931   GFRAA  07/26/2007 0931    >60        The eGFR has been calculated using the MDRD equation. This calculation has not been validated in all clinical  Review of her 2013 thyroid uptake and scan revealed a large cold nodule which was seen to arise from the inferior pole of the right lobe of the thyroid-reportedly biopsied with benign outcomes.  Cytology reports show that they were benign with some Hurthle cells.  Thyroid ultrasound from October 03, 2016 showed 4.4 cm right inferior pole solid thyroid nodule on the right lobe of the thyroid.  -This nodule appeared as a cold mass on the thyroid uptake and scan on October 26, 2017 which showed 43% uptake in 24 hours consistent with hyperthyroidism.  Fine-needle aspiration of dominant right-sided nodule of the thyroid on November 08, 2017 Diagnosis THYROID, FINE NEEDLE ASPIRATION, RIGHT (SPECIMEN 1 OF 1 COLLECTED 11/08/2017 CONSISTENT WITH BENIGN FOLLICULAR NODULE (BETHESDA CATEGORY II)  Recent Results (from the past 2160 hour(s))  TSH     Status: Abnormal   Collection Time: 04/03/20  9:16 AM  Result Value Ref Range   TSH 7.52 (H) 0.40 - 4.50 mIU/L  T4, free     Status: None   Collection Time: 04/03/20  9:16 AM  Result Value Ref Range   Free T4 0.8 0.8 - 1.8 ng/dL  T3, free     Status: None   Collection Time: 04/03/20  9:16 AM  Result Value Ref Range   T3, Free 2.7 2.3 - 4.2 pg/mL  TSH     Status: None   Collection Time: 06/16/20 10:43 AM  Result Value Ref Range   TSH 3.49 0.40 - 4.50 mIU/L  T4, free     Status: None   Collection Time: 06/16/20 10:43 AM  Result Value Ref Range   Free T4 1.0 0.8 - 1.8 ng/dL    Recent Results (from the past 2160 hour(s))  TSH     Status: Abnormal   Collection Time: 04/03/20  9:16 AM  Result Value Ref Range   TSH 7.52 (H) 0.40 - 4.50 mIU/L  T4, free     Status: None   Collection Time: 04/03/20  9:16 AM  Result Value  Ref Range   Free T4 0.8 0.8 - 1.8 ng/dL  T3, free     Status: None   Collection Time: 04/03/20  9:16 AM  Result Value Ref Range   T3, Free 2.7 2.3 - 4.2 pg/mL  TSH     Status: None   Collection Time: 06/16/20 10:43 AM  Result Value Ref Range   TSH 3.49 0.40 - 4.50 mIU/L  T4, free     Status: None   Collection Time: 06/16/20 10:43 AM  Result Value Ref Range   Free T4 1.0 0.8 - 1.8 ng/dL  Free T4 1.6, total T4 8.2    Assessment & Plan:    1.  RAI induced hypothyroidism  -Her presentation now is consistent with treatment effect with clinical hypothyroidism. -Her previsit thyroid function tests are such that she would benefit from a higher dose of levothyroxine.  I discussed and increase her levothyroxine to 50 mcg p.o. daily before breakfast, with plan to repeat thyroid function test and office visit in 10 weeks.   - We discussed about the correct intake of her thyroid hormone, on empty stomach at fasting, with water, separated by at least 30 minutes from breakfast and other medications,  and separated by more than 4 hours from calcium, iron, multivitamins, acid reflux medications (PPIs). -Patient is made aware of the fact that thyroid hormone replacement is needed for life, dose to be adjusted by periodic monitoring of thyroid function tests.  2. Cold Nodule on right Lobe of Thyroid -negative biopsy for malignancy -She will be considered for repeat thyroid/neck ultrasound after her next visit.     - She wishes to follow up with her PCP for treatment of osteoporosis currently on prolia a status post 2 doses.  Regarding her weight concern: - she  admits there is a room for improvement in her diet and drink choices. -  Suggestion is made for her to avoid simple carbohydrates  from her diet including Cakes, Sweet Desserts / Pastries, Ice Cream, Soda (diet and regular), Sweet Tea, Candies, Chips, Cookies, Sweet Pastries,  Store Bought Juices, Alcohol in Excess of  1-2 drinks a day,  Artificial Sweeteners, Coffee Creamer, and "Sugar-free" Products. This will help patient to have stable blood glucose profile and potentially avoid unintended weight gain.   - I advised patient to maintain close follow up with Antionette Char, MD for primary care needs.     - Time spent on this patient care encounter:  20 minutes of which 50% was spent in  counseling and the rest reviewing  her current and  previous labs / studies and medications  doses and developing a plan for long term care. Cornell Barman  participated in the discussions, expressed understanding, and voiced agreement with the above plans.  All questions were answered to her satisfaction. she is encouraged to contact clinic should she have any questions or concerns prior to her return visit.   Follow up plan: Return in about 10 weeks (around 09/01/2020) for F/U with Pre-visit Labs.   Glade Lloyd, MD [facility] [address] [place], [place] [address] Phone: [phone]  Fax: [phone]     06/23/2020, 12:54 PM  This note was partially dictated with voice recognition software. Similar sounding words can be transcribed inadequately or may not  be corrected upon review.

## 2020-08-24 ENCOUNTER — Telehealth: Payer: Self-pay

## 2020-08-24 NOTE — Telephone Encounter (Signed)
Yes

## 2020-08-24 NOTE — Telephone Encounter (Signed)
Patient would like to know if her visit on 12/8 can be virtual due to a broken leg. She is going for labs on 12/1

## 2020-08-27 LAB — TSH: TSH: 1.68 u[IU]/mL (ref 0.450–4.500)

## 2020-08-27 LAB — T4, FREE: Free T4: 1.25 ng/dL (ref 0.82–1.77)

## 2020-09-02 ENCOUNTER — Telehealth (INDEPENDENT_AMBULATORY_CARE_PROVIDER_SITE_OTHER): Payer: Medicare PPO | Admitting: "Endocrinology

## 2020-09-02 ENCOUNTER — Encounter: Payer: Self-pay | Admitting: "Endocrinology

## 2020-09-02 VITALS — BP 120/70 | Ht 59.0 in | Wt 142.0 lb

## 2020-09-02 DIAGNOSIS — E890 Postprocedural hypothyroidism: Secondary | ICD-10-CM | POA: Diagnosis not present

## 2020-09-02 DIAGNOSIS — E042 Nontoxic multinodular goiter: Secondary | ICD-10-CM | POA: Diagnosis not present

## 2020-09-02 NOTE — Progress Notes (Signed)
09/02/2020, 1:57 PM                    Endocrinology follow-up note    Subjective:    Patient ID: Jody Howe, female    DOB: 07-05-1946, PCP Antionette Char, MD   Past Medical History:  Diagnosis Date  . Allergic rhinitis   . Arthritis   . Constipation   . Cystocele    mild on exam  . Goiter   . Medical history non-contributory   . Osteoporosis   . Plantar fasciitis    Past Surgical History:  Procedure Laterality Date  . BASAL CELL CARCINOMA EXCISION    . CATARACT EXTRACTION    . COLONOSCOPY WITH PROPOFOL N/A 02/15/2016   Procedure: COLONOSCOPY WITH PROPOFOL;  Surgeon: Hulen Luster, MD;  Location: [facility] ENDOSCOPY;  Service: Gastroenterology;  Laterality: N/A;  . NOSE SURGERY     excision and flap - [facility] left nose  . RETINAL LASER PROCEDURE  10/08   laster retinal surgery  . TONSILLECTOMY     Social History   Socioeconomic History  . Marital status: Married    Spouse name: Not on file  . Number of children: Not on file  . Years of education: Not on file  . Highest education level: Not on file  Occupational History  . Not on file  Tobacco Use  . Smoking status: Never Smoker  . Smokeless tobacco: Never Used  Vaping Use  . Vaping Use: Never used  Substance and Sexual Activity  . Alcohol use: No  . Drug use: No  . Sexual activity: Not on file  Other Topics Concern  . Not on file  Social History Narrative  . Not on file   Social Determinants of Health   Financial Resource Strain:   . Difficulty of Paying Living Expenses: Not on file  Food Insecurity:   . Worried About Charity fundraiser in the Last Year: Not on file  . Ran Out of Food in the Last Year: Not on file  Transportation Needs:   . Lack of Transportation (Medical): Not on file  . Lack of Transportation (Non-Medical): Not on file  Physical Activity:   . Days of Exercise per Week: Not on file  . Minutes of Exercise per Session: Not on file   Stress:   . Feeling of Stress : Not on file  Social Connections:   . Frequency of Communication with Friends and Family: Not on file  . Frequency of Social Gatherings with Friends and Family: Not on file  . Attends Religious Services: Not on file  . Active Member of Clubs or Organizations: Not on file  . Attends Archivist Meetings: Not on file  . Marital Status: Not on file   Outpatient Encounter Medications as of 09/02/2020  Medication Sig  . aspirin EC 81 MG tablet Take 81 mg by mouth daily. Swallow whole.  . fish oil-omega-3 fatty acids 1000 MG capsule Take 1 g by mouth daily.  . Magnesium 400 MG CAPS Take 1 capsule by mouth daily.  . Ascorbic Acid (VITAMIN C) 500 MG CAPS Take 1 capsule by mouth 2 (two) times daily.  . Calcium Carb-Cholecalciferol (CALCIUM  600+D) 600-800 MG-UNIT TABS Take 1 tablet by mouth 2 (two) times daily.  . Cholecalciferol (VITAMIN D3) 125 MCG (5000 UT) CAPS Take 1 capsule by mouth daily.  Marland Kitchen denosumab (PROLIA) 60 MG/ML SOLN injection Inject 60 mg into the skin every 6 (six) months. Administer in upper arm, thigh, or abdomen  . docusate sodium (COLACE) 100 MG capsule Take 1 capsule by mouth 2 (two) times daily.  . Glucosamine-Chondroitin (GLUCOSAMINE CHONDR COMPLEX PO) Take 1 tablet by mouth 2 (two) times daily.  Marland Kitchen levothyroxine (SYNTHROID) 50 MCG tablet Take 1 tablet (50 mcg total) by mouth daily before breakfast.  . Psyllium Husk POWD Take by mouth.  . rosuvastatin (CRESTOR) 10 MG tablet Take 1 tablet by mouth daily.  . [DISCONTINUED] Magnesium 250 MG TABS Take by mouth daily.  . [DISCONTINUED] Omega-3 Fatty Acids (FISH OIL) 1200 MG CAPS Take 1 capsule by mouth 2 (two) times daily.   No facility-administered encounter medications on file as of 09/02/2020.   ALLERGIES: Allergies  Allergen Reactions  . Ceftin [Cefuroxime Axetil] Other (See Comments)  . Other Hives  . Sulfa Antibiotics Hives    VACCINATION STATUS: Immunization History   Administered Date(s) Administered  . Influenza-Unspecified 07/09/2014    HPI Jody Howe is 74 y.o. female who presents today with a medical history as above. 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    Her treatments were administered in February 2019 and June 2019.   -She is currently on levothyroxine 50 mcg p.o. daily before breakfast.  She reports compliance with medication.  She reports steady weight, no new complaints. -In the interval, she had a lot more associated leg injury, reportedly did not require any surgery.  she denies family history of thyroid dysfunction. No family history of thyroid cancer. - Around 2013, she was found to have cold nodule on her right thyroid which reportedly was biopsied and showed benign findings.   - Her repeat thyroid ultrasound shows 4.4 cm nodule in the general area of the previously biopsied nodule on the right lobe of her thyroid.  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  - She has history of osteoporosis on prolia treatment.  Review of Systems Limited as above.  Objective:    BP 120/70   Ht '4\' 11"'  (1.499 m)   Wt 142 lb (64.4 kg)   BMI 28.68 kg/m   Wt Readings from Last 3 Encounters:  09/02/20 142 lb (64.4 kg)  06/23/20 149 lb 6.4 oz (67.8 kg)  04/10/20 145 lb 3.2 oz (65.9 kg)    Physical Exam    CMP     Component Value Date/Time   NA 138 07/26/2007 0931   K 3.8 07/26/2007 0931   CL 106 07/26/2007 0931   CO2 25 07/26/2007 0931   GLUCOSE 92 07/26/2007 0931   BUN 13 07/26/2007 0931   CREATININE 0.61 07/26/2007 0931   CALCIUM 8.6 07/26/2007 0931   GFRNONAA >60 07/26/2007 0931   GFRAA  07/26/2007 0931    >60        The eGFR has been calculated using the MDRD equation. This calculation has not been validated in all clinical   Review of her 2013 thyroid uptake and  scan revealed a large cold nodule which was seen to arise from the inferior pole of the right lobe of the thyroid-reportedly biopsied with benign outcomes.  Cytology reports show that they  were benign with some Hurthle cells.  Thyroid ultrasound from October 03, 2016 showed 4.4 cm right inferior pole solid thyroid nodule on the right lobe of the thyroid.  -This nodule appeared as a cold mass on the thyroid uptake and scan on October 26, 2017 which showed 43% uptake in 24 hours consistent with hyperthyroidism.  Fine-needle aspiration of dominant right-sided nodule of the thyroid on November 08, 2017 Diagnosis THYROID, FINE NEEDLE ASPIRATION, RIGHT (SPECIMEN 1 OF 1 COLLECTED 11/08/2017 CONSISTENT WITH BENIGN FOLLICULAR NODULE (BETHESDA CATEGORY II)  Recent Results (from the past 2160 hour(s))  TSH     Status: None   Collection Time: 06/16/20 10:43 AM  Result Value Ref Range   TSH 3.49 0.40 - 4.50 mIU/L  T4, free     Status: None   Collection Time: 06/16/20 10:43 AM  Result Value Ref Range   Free T4 1.0 0.8 - 1.8 ng/dL  TSH     Status: None   Collection Time: 08/26/20  2:04 PM  Result Value Ref Range   TSH 1.680 0.450 - 4.500 uIU/mL  T4, free     Status: None   Collection Time: 08/26/20  2:04 PM  Result Value Ref Range   Free T4 1.25 0.82 - 1.77 ng/dL    Recent Results (from the past 2160 hour(s))  TSH     Status: None   Collection Time: 06/16/20 10:43 AM  Result Value Ref Range   TSH 3.49 0.40 - 4.50 mIU/L  T4, free     Status: None   Collection Time: 06/16/20 10:43 AM  Result Value Ref Range   Free T4 1.0 0.8 - 1.8 ng/dL  TSH     Status: None   Collection Time: 08/26/20  2:04 PM  Result Value Ref Range   TSH 1.680 0.450 - 4.500 uIU/mL  T4, free     Status: None   Collection Time: 08/26/20  2:04 PM  Result Value Ref Range   Free T4 1.25 0.82 - 1.77 ng/dL  Free T4 1.6, total T4 8.2    Assessment & Plan:    1.  RAI induced hypothyroidism  -Her presentation now is  consistent with appropriate replacement with levothyroxine.  She is advised to continue levothyroxine 50 mcg p.o. daily before breakfast.     - We discussed about the correct intake of her thyroid hormone, on empty stomach at fasting, with water, separated by at least 30 minutes from breakfast and other medications,  and separated by more than 4 hours from calcium, iron, multivitamins, acid reflux medications (PPIs). -Patient is made aware of the fact that thyroid hormone replacement is needed for life, dose to be adjusted by periodic monitoring of thyroid function tests.   2. Cold Nodule on right Lobe of Thyroid -negative biopsy for malignancy -She will be considered for repeat surveillance thyroid/neck ultrasound before her next visit.     - She wishes to follow up with her PCP for treatment of osteoporosis currently on prolia a status post 2 doses.   - I advised patient to maintain close follow up with Antionette Char, MD for primary care needs.      - Time spent on this patient care encounter:  20 minutes of which 50% was spent in  counseling and the rest reviewing  her current and  previous labs / studies and medications  doses and developing a plan for long term care. Cornell Barman  participated in the discussions, expressed understanding, and voiced agreement  with the above plans.  All questions were answered to her satisfaction. she is encouraged to contact clinic should she have any questions or concerns prior to her return visit.    Follow up plan: Return in about 6 months (around 03/03/2021) for F/U with Pre-visit Labs, Thyroid / Neck Ultrasound.   Glade Lloyd, MD [facility] [address] [place], [place] [address] Phone: [phone]  Fax: [phone]     09/02/2020, 1:57 PM  This note was partially dictated with voice recognition software. Similar sounding words can be transcribed inadequately or may not  be  corrected upon review.

## 2020-10-30 ENCOUNTER — Other Ambulatory Visit: Payer: Self-pay | Admitting: "Endocrinology

## 2021-01-05 LAB — TSH: TSH: 3.31 (ref 0.41–5.90)

## 2021-01-25 ENCOUNTER — Other Ambulatory Visit: Payer: Self-pay | Admitting: "Endocrinology

## 2021-02-15 ENCOUNTER — Ambulatory Visit (HOSPITAL_COMMUNITY)
Admission: RE | Admit: 2021-02-15 | Discharge: 2021-02-15 | Disposition: A | Discharge status: Home / Self Care | Payer: Medicare PPO | Source: Ambulatory Visit | Attending: "Endocrinology | Admitting: "Endocrinology

## 2021-02-15 DIAGNOSIS — E042 Nontoxic multinodular goiter: Secondary | ICD-10-CM | POA: Diagnosis present

## 2021-02-25 LAB — TSH: TSH: 2.25 u[IU]/mL (ref 0.450–4.500)

## 2021-02-25 LAB — T4, FREE: Free T4: 1.35 ng/dL (ref 0.82–1.77)

## 2021-03-03 ENCOUNTER — Other Ambulatory Visit: Payer: Self-pay

## 2021-03-03 ENCOUNTER — Encounter: Payer: Self-pay | Admitting: "Endocrinology

## 2021-03-03 ENCOUNTER — Ambulatory Visit (INDEPENDENT_AMBULATORY_CARE_PROVIDER_SITE_OTHER): Payer: Medicare PPO | Admitting: "Endocrinology

## 2021-03-03 VITALS — BP 122/74 | HR 52 | Ht 59.0 in | Wt 151.0 lb

## 2021-03-03 DIAGNOSIS — E042 Nontoxic multinodular goiter: Secondary | ICD-10-CM | POA: Diagnosis not present

## 2021-03-03 DIAGNOSIS — E890 Postprocedural hypothyroidism: Secondary | ICD-10-CM | POA: Diagnosis not present

## 2021-03-03 NOTE — Progress Notes (Signed)
03/03/2021, 12:31 PM                   Endocrinology follow-up note   Subjective:    Patient ID: Jody Howe, female    DOB: [birth year], PCP [name], FNP   Past Medical History:  Diagnosis Date  . Allergic rhinitis   . Arthritis   . Constipation   . Cystocele    mild on exam  . Goiter   . Medical history non-contributory   . Osteoporosis   . Plantar fasciitis    Past Surgical History:  Procedure Laterality Date  . BASAL CELL CARCINOMA EXCISION    . CATARACT EXTRACTION    . COLONOSCOPY WITH PROPOFOL N/A 02/15/2016   Procedure: COLONOSCOPY WITH PROPOFOL;  Surgeon: Hulen Luster, MD;  Location: [facility] ENDOSCOPY;  Service: Gastroenterology;  Laterality: N/A;  . NOSE SURGERY     excision and flap - [facility] left nose  . RETINAL LASER PROCEDURE  10/08   laster retinal surgery  . TONSILLECTOMY     Social History   Socioeconomic History  . Marital status: Married    Spouse name: Not on file  . Number of children: Not on file  . Years of education: Not on file  . Highest education level: Not on file  Occupational History  . Not on file  Tobacco Use  . Smoking status: Never Smoker  . Smokeless tobacco: Never Used  Vaping Use  . Vaping Use: Never used  Substance and Sexual Activity  . Alcohol use: No  . Drug use: No  . Sexual activity: Not on file  Other Topics Concern  . Not on file  Social History Narrative  . Not on file   Social Determinants of Health   Financial Resource Strain: Not on file  Food Insecurity: Not on file  Transportation Needs: Not on file  Physical Activity: Not on file  Stress: Not on file  Social Connections: Not on file   Outpatient Encounter Medications as of 03/03/2021  Medication Sig  . Ascorbic Acid (VITAMIN C) 500 MG CAPS Take 1 capsule by mouth 2 (two) times daily.  Marland Kitchen aspirin EC 81 MG tablet Take 81 mg by mouth daily. Swallow whole.  . Calcium Carb-Cholecalciferol (CALCIUM  600+D) 600-800 MG-UNIT TABS Take 1 tablet by mouth daily.  . Cholecalciferol (VITAMIN D3) 125 MCG (5000 UT) CAPS Take 1 capsule by mouth daily.  Marland Kitchen denosumab (PROLIA) 60 MG/ML SOLN injection Inject 60 mg into the skin every 6 (six) months. Administer in upper arm, thigh, or abdomen  . docusate sodium (COLACE) 100 MG capsule Take 2 capsules by mouth 2 (two) times daily.  . fish oil-omega-3 fatty acids 1000 MG capsule Take 1 g by mouth daily.  . Glucosamine-Chondroitin (GLUCOSAMINE CHONDR COMPLEX PO) Take 2 tablets by mouth 2 (two) times daily.  Marland Kitchen levothyroxine (SYNTHROID) 50 MCG tablet TAKE 1 TABLET BY MOUTH ONCE DAILY BEFORE BREAKFAST.  . Magnesium 400 MG CAPS Take 1 capsule by mouth daily.  . Psyllium Husk POWD Take by mouth.  . rosuvastatin (CRESTOR) 10 MG tablet Take 1 tablet by mouth daily.   No facility-administered encounter medications on file as of 03/03/2021.   ALLERGIES: Allergies  Allergen Reactions  . Ceftin [Cefuroxime Axetil] Other (See Comments)  . Other Hives  . Sulfa Antibiotics Hives    VACCINATION STATUS: Immunization History  Administered Date(s) Administered  . Influenza-Unspecified 07/09/2014    HPI Jody Howe is 75 y.o. female who presents today with a medical history as above. 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    Her treatments were administered in February 2019 and June 2019.   -She is currently on levothyroxine 50 mcg p.o. daily before breakfast.     She reports compliance with medication.  She reports steady weight, no new complaints. -In the interval, she had a lot more associated leg injury, reportedly did not require any surgery.  she denies family history of thyroid dysfunction. No family history of thyroid cancer. - Around 2013, she was found to have cold nodule on her right thyroid which reportedly was biopsied and showed benign  findings.   - Her repeat thyroid ultrasound shows decreasing size of thyroid lobes and bilateral thyroid nodules.  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  - She has history of osteoporosis on prolia treatment.  Review of Systems Limited as above.  Objective:    BP 122/74   Pulse (!) 52   Ht '4\' 11"'  (1.499 m)   Wt 151 lb (68.5 kg)   BMI 30.50 kg/m   Wt Readings from Last 3 Encounters:  03/03/21 151 lb (68.5 kg)  09/02/20 142 lb (64.4 kg)  06/23/20 149 lb 6.4 oz (67.8 kg)    Physical Exam    CMP     Component Value Date/Time   NA 138 07/26/2007 0931   K 3.8 07/26/2007 0931   CL 106 07/26/2007 0931   CO2 25 07/26/2007 0931   GLUCOSE 92 07/26/2007 0931   BUN 13 07/26/2007 0931   CREATININE 0.61 07/26/2007 0931   CALCIUM 8.6 07/26/2007 0931   GFRNONAA >60 07/26/2007 0931   GFRAA  07/26/2007 0931    >60        The eGFR has been calculated using the MDRD equation. This calculation has not been validated in all clinical   Review of her 2013 thyroid uptake and scan revealed a large cold nodule which was seen to arise from the inferior pole of the right lobe of the thyroid-reportedly biopsied with benign outcomes.  Cytology reports show that they were benign with some Hurthle cells.  Thyroid ultrasound from October 03, 2016 showed 4.4 cm right inferior pole solid thyroid nodule on the right lobe of the thyroid.  -This nodule appeared as a cold mass on the thyroid uptake and scan on October 26, 2017 which showed 43% uptake in 24 hours consistent with hyperthyroidism.  Fine-needle aspiration of dominant right-sided nodule of the thyroid on November 08, 2017 Diagnosis THYROID, FINE NEEDLE ASPIRATION, RIGHT (SPECIMEN 1 OF 1 COLLECTED 11/08/2017 CONSISTENT WITH BENIGN FOLLICULAR NODULE (BETHESDA CATEGORY II)  Recent Results (from the past 2160 hour(s))  TSH     Status: None   Collection Time: 01/04/21 12:00 AM  Result Value Ref  Range   TSH 3.31 0.41 - 5.90    Comment: T4,FREE 1.15  TSH     Status: None   Collection Time: 02/24/21  1:09 PM  Result Value Ref Range   TSH 2.250 0.450 - 4.500 uIU/mL  T4, free     Status: None   Collection Time: 02/24/21  1:09  PM  Result Value Ref Range   Free T4 1.35 0.82 - 1.77 ng/dL    Recent Results (from the past 2160 hour(s))  TSH     Status: None   Collection Time: 01/04/21 12:00 AM  Result Value Ref Range   TSH 3.31 0.41 - 5.90    Comment: T4,FREE 1.15  TSH     Status: None   Collection Time: 02/24/21  1:09 PM  Result Value Ref Range   TSH 2.250 0.450 - 4.500 uIU/mL  T4, free     Status: None   Collection Time: 02/24/21  1:09 PM  Result Value Ref Range   Free T4 1.35 0.82 - 1.77 ng/dL    FINDINGS: Parenchymal Echotexture: Moderately heterogeneous  Isthmus: 0.2 cm  Right lobe: 4.4 x 2.6 x 3.0 cm  Left lobe: 2.7 x 1.3 x 1.2 cm  IMPRESSION: 1. Nodule 1 located in the isthmus does not meet criteria for FNA or surveillance. 2. Nodule 2 located in the inferior right thyroid lobe is not significantly changed in size since prior examination. Prior FNA performed in 2019.   Assessment & Plan:    1.  RAI induced hypothyroidism  -Her presentation now is consistent with appropriate replacement with levothyroxine.  She is advised to continue levothyroxine 50 mcg p.o. daily before breakfast.     - We discussed about the correct intake of her thyroid hormone, on empty stomach at fasting, with water, separated by at least 30 minutes from breakfast and other medications,  and separated by more than 4 hours from calcium, iron, multivitamins, acid reflux medications (PPIs). -Patient is made aware of the fact that thyroid hormone replacement is needed for life, dose to be adjusted by periodic monitoring of thyroid function tests.   2. Cold Nodule on right Lobe of Thyroid -negative biopsy for malignancy -Her previsit surveillance thyroid/neck ultrasound was  significant for shrinking size of bilateral thyroid lobes and nodules.  She will not need intervention with biopsy or surgery at this time.   She may need thyroid ultrasound in 2 years to monitor the nodular size.  - She wishes to follow up with her PCP for treatment of osteoporosis currently on prolia a status post 2 doses.   - I advised patient to maintain close follow up with [name], FNP for primary care needs.    I spent 25 minutes in the care of the patient today including review of labs from Thyroid Function, CMP, and other relevant labs ; imaging/biopsy records (current and previous including abstractions from other facilities); face-to-face time discussing  her lab results and symptoms, medications doses, her options of short and long term treatment based on the latest standards of care / guidelines;   and documenting the encounter.  Cornell Barman  participated in the discussions, expressed understanding, and voiced agreement with the above plans.  All questions were answered to her satisfaction. she is encouraged to contact clinic should she have any questions or concerns prior to her return visit.     Follow up plan: Return in about 6 months (around 09/02/2021) for F/U with Pre-visit Labs.   Glade Lloyd, MD [facility] [address] [place], [place] [address] Phone: [phone]  Fax: [phone]     03/03/2021, 12:31 PM  This note was partially dictated with voice recognition software. Similar sounding words can be transcribed inadequately or may not  be corrected upon review.

## 2021-04-18 ENCOUNTER — Other Ambulatory Visit: Payer: Self-pay | Admitting: "Endocrinology

## 2021-06-28 ENCOUNTER — Other Ambulatory Visit (HOSPITAL_COMMUNITY): Payer: Self-pay | Admitting: Family Medicine

## 2021-06-28 DIAGNOSIS — Z1231 Encounter for screening mammogram for malignant neoplasm of breast: Secondary | ICD-10-CM

## 2021-07-16 ENCOUNTER — Other Ambulatory Visit: Payer: Self-pay

## 2021-07-16 ENCOUNTER — Ambulatory Visit (HOSPITAL_COMMUNITY)
Admission: RE | Admit: 2021-07-16 | Discharge: 2021-07-16 | Disposition: A | Discharge status: Home / Self Care | Payer: Medicare PPO | Source: Ambulatory Visit | Attending: Family Medicine | Admitting: Family Medicine

## 2021-07-16 DIAGNOSIS — Z1231 Encounter for screening mammogram for malignant neoplasm of breast: Secondary | ICD-10-CM | POA: Diagnosis not present

## 2021-07-22 ENCOUNTER — Other Ambulatory Visit: Payer: Self-pay | Admitting: "Endocrinology

## 2021-08-31 LAB — TSH: TSH: 5.83 u[IU]/mL — ABNORMAL HIGH (ref 0.450–4.500)

## 2021-08-31 LAB — T4, FREE: Free T4: 1.15 ng/dL (ref 0.82–1.77)

## 2021-09-06 ENCOUNTER — Encounter: Payer: Self-pay | Admitting: "Endocrinology

## 2021-09-06 ENCOUNTER — Ambulatory Visit: Payer: Medicare PPO | Admitting: "Endocrinology

## 2021-09-06 ENCOUNTER — Other Ambulatory Visit: Payer: Self-pay

## 2021-09-06 VITALS — BP 120/74 | HR 60 | Ht 59.0 in | Wt 151.6 lb

## 2021-09-06 DIAGNOSIS — E042 Nontoxic multinodular goiter: Secondary | ICD-10-CM | POA: Diagnosis not present

## 2021-09-06 DIAGNOSIS — E890 Postprocedural hypothyroidism: Secondary | ICD-10-CM

## 2021-09-06 MED ORDER — LEVOTHYROXINE SODIUM 75 MCG PO TABS: 75.0000 ug | ORAL_TABLET | Freq: Every day | ORAL | 1 refills | Status: DC

## 2021-09-06 NOTE — Progress Notes (Signed)
09/06/2021, 4:40 PM                   Endocrinology follow-up note   Subjective:    Patient ID: Jody Howe, female    DOB: 1945/10/05, PCP [name], FNP   Past Medical History:  Diagnosis Date   Allergic rhinitis    Arthritis    Constipation    Cystocele    mild on exam   Goiter    Medical history non-contributory    Osteoporosis    Plantar fasciitis    Past Surgical History:  Procedure Laterality Date   BASAL CELL CARCINOMA EXCISION     CATARACT EXTRACTION     COLONOSCOPY WITH PROPOFOL N/A 02/15/2016   Procedure: COLONOSCOPY WITH PROPOFOL;  Surgeon: Hulen Luster, MD;  Location: [facility] ENDOSCOPY;  Service: Gastroenterology;  Laterality: N/A;   NOSE SURGERY     excision and flap - [facility] left nose   RETINAL LASER PROCEDURE  10/08   laster retinal surgery   TONSILLECTOMY     Social History   Socioeconomic History   Marital status: Married    Spouse name: Not on file   Number of children: Not on file   Years of education: Not on file   Highest education level: Not on file  Occupational History   Not on file  Tobacco Use   Smoking status: Never   Smokeless tobacco: Never  Vaping Use   Vaping Use: Never used  Substance and Sexual Activity   Alcohol use: No   Drug use: No   Sexual activity: Not on file  Other Topics Concern   Not on file  Social History Narrative   Not on file   Social Determinants of Health   Financial Resource Strain: Not on file  Food Insecurity: Not on file  Transportation Needs: Not on file  Physical Activity: Not on file  Stress: Not on file  Social Connections: Not on file   Outpatient Encounter Medications as of 09/06/2021  Medication Sig   Omega-3 Fatty Acids (FISH OIL) 600 MG CAPS Take 1 capsule by mouth daily in the afternoon.   Ascorbic Acid (VITAMIN C) 500 MG CAPS Take 1 capsule by mouth 2 (two) times daily.   aspirin EC 81 MG tablet Take 81 mg by mouth daily as  needed. Swallow whole.   Calcium Carb-Cholecalciferol (CALCIUM 600+D) 600-800 MG-UNIT TABS Take 1 tablet by mouth daily.   Cholecalciferol (VITAMIN D3) 125 MCG (5000 UT) CAPS Take 1 capsule by mouth daily.   denosumab (PROLIA) 60 MG/ML SOLN injection Inject 60 mg into the skin every 6 (six) months. Administer in upper arm, thigh, or abdomen   docusate sodium (COLACE) 100 MG capsule Take 2 capsules by mouth 2 (two) times daily.   Glucosamine-Chondroitin (GLUCOSAMINE CHONDR COMPLEX PO) Take 1 tablet by mouth 2 (two) times daily.   levothyroxine (SYNTHROID) 75 MCG tablet Take 1 tablet (75 mcg total) by mouth daily before breakfast.   Magnesium 400 MG CAPS Take 1 capsule by mouth daily.   Psyllium Husk POWD Take by mouth.   rosuvastatin (CRESTOR) 10 MG tablet Take 1 tablet by mouth daily.   [DISCONTINUED] fish oil-omega-3 fatty acids 1000 MG  capsule Take 1 g by mouth daily.   [DISCONTINUED] levothyroxine (SYNTHROID) 50 MCG tablet TAKE 1 TABLET BY MOUTH ONCE DAILY BEFORE BREAKFAST.   No facility-administered encounter medications on file as of 09/06/2021.   ALLERGIES: Allergies  Allergen Reactions   Ceftin [Cefuroxime Axetil] Other (See Comments)   Other Hives   Sulfa Antibiotics Hives    VACCINATION STATUS: Immunization History  Administered Date(s) Administered   Influenza-Unspecified 07/09/2014    HPI Jody Howe is 75 y.o. female who presents today with a medical history as above. 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    Her treatments were administered in February 2019 and June 2019.   -She is currently on levothyroxine 50 mcg p.o. daily before breakfast.     She reports compliance with medication.  She reports steady weight, no new complaints today.  She reports better consistent energy level.  she denies family history of thyroid dysfunction. No family history of  thyroid cancer. - Around 2013, she was found to have cold nodule on her right thyroid which reportedly was biopsied and showed benign findings.   - Her repeat thyroid ultrasound shows decreasing size of thyroid lobes and bilateral thyroid nodules.  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  - She has history of osteoporosis on prolia treatment.  Review of Systems Limited as above.  Objective:    BP 120/74   Pulse 60   Ht '4\' 11"'  (1.499 m)   Wt 151 lb 9.6 oz (68.8 kg)   BMI 30.62 kg/m   Wt Readings from Last 3 Encounters:  09/06/21 151 lb 9.6 oz (68.8 kg)  03/03/21 151 lb (68.5 kg)  09/02/20 142 lb (64.4 kg)    Physical Exam    CMP     Component Value Date/Time   NA 138 07/26/2007 0931   K 3.8 07/26/2007 0931   CL 106 07/26/2007 0931   CO2 25 07/26/2007 0931   GLUCOSE 92 07/26/2007 0931   BUN 13 07/26/2007 0931   CREATININE 0.61 07/26/2007 0931   CALCIUM 8.6 07/26/2007 0931   GFRNONAA >60 07/26/2007 0931   GFRAA  07/26/2007 0931    >60        The eGFR has been calculated using the MDRD equation. This calculation has not been validated in all clinical   Review of her 2013 thyroid uptake and scan revealed a large cold nodule which was seen to arise from the inferior pole of the right lobe of the thyroid-reportedly biopsied with benign outcomes.  Cytology reports show that they were benign with some Hurthle cells.  Thyroid ultrasound from October 03, 2016 showed 4.4 cm right inferior pole solid thyroid nodule on the right lobe of the thyroid.  -This nodule appeared as a cold mass on the thyroid uptake and scan on October 26, 2017 which showed 43% uptake in 24 hours consistent with hyperthyroidism.  Fine-needle aspiration of dominant right-sided nodule of the thyroid on November 08, 2017 Diagnosis THYROID, FINE NEEDLE ASPIRATION, RIGHT (SPECIMEN 1 OF 1 COLLECTED 11/08/2017 CONSISTENT WITH BENIGN FOLLICULAR NODULE (BETHESDA  CATEGORY II)  Recent Results (from the past 2160 hour(s))  TSH     Status: Abnormal   Collection Time: 08/30/21 10:30 AM  Result Value Ref Range   TSH 5.830 (H) 0.450 - 4.500 uIU/mL  T4, free     Status: None   Collection Time: 08/30/21 10:30 AM  Result Value Ref Range   Free T4 1.15 0.82 - 1.77 ng/dL    Recent Results (from the past 2160 hour(s))  TSH     Status: Abnormal   Collection Time: 08/30/21 10:30 AM  Result Value Ref Range   TSH 5.830 (H) 0.450 - 4.500 uIU/mL  T4, free     Status: None   Collection Time: 08/30/21 10:30 AM  Result Value Ref Range   Free T4 1.15 0.82 - 1.77 ng/dL    FINDINGS: Parenchymal Echotexture: Moderately heterogeneous   Isthmus: 0.2 cm   Right lobe: 4.4 x 2.6 x 3.0 cm   Left lobe: 2.7 x 1.3 x 1.2 cm  IMPRESSION: 1. Nodule 1 located in the isthmus does not meet criteria for FNA or surveillance. 2. Nodule 2 located in the inferior right thyroid lobe is not significantly changed in size since prior examination. Prior FNA performed in 2019.   Assessment & Plan:    1.  RAI induced hypothyroidism  -Her presentation now is consistent with inadequate replacement.  I discussed and increase her levothyroxine to 75 mcg daily before breakfast.     - We discussed about the correct intake of her thyroid hormone, on empty stomach at fasting, with water, separated by at least 30 minutes from breakfast and other medications,  and separated by more than 4 hours from calcium, iron, multivitamins, acid reflux medications (PPIs). -Patient is made aware of the fact that thyroid hormone replacement is needed for life, dose to be adjusted by periodic monitoring of thyroid function tests.  2. Cold Nodule on right Lobe of Thyroid -negative biopsy for malignancy -Her previsit surveillance thyroid/neck ultrasound was significant for shrinking size of bilateral thyroid lobes and nodules.  She will not need intervention with biopsy or surgery at this time.   She  may need thyroid ultrasound in 1 years to monitor the nodular size.  - She wishes to follow up with her PCP for treatment of osteoporosis currently on prolia a status post 2 doses.   - I advised patient to maintain close follow up with [name], FNP for primary care needs.    I spent 21 minutes in the care of the patient today including review of labs from Thyroid Function, CMP, and other relevant labs ; imaging/biopsy records (current and previous including abstractions from other facilities); face-to-face time discussing  her lab results and symptoms, medications doses, her options of short and long term treatment based on the latest standards of care / guidelines;   and documenting the encounter.  Cornell Barman  participated in the discussions, expressed understanding, and voiced agreement with the above plans.  All questions were answered to her satisfaction. she is encouraged to contact clinic should she have any questions or concerns prior to her return visit.    Follow up plan: Return in about 4 months (around 01/05/2022) for F/U with Pre-visit Labs.   Glade Lloyd, MD [facility] [address] [place], [place] [address] Phone: [phone]  Fax: [phone]     09/06/2021, 4:40 PM  This note was partially dictated with voice recognition software. Similar sounding words can be transcribed inadequately or may not  be corrected upon review.

## 2021-09-26 IMAGING — US US THYROID
1 series · 13 of 25 positions shown · non-contrast
Comparison: 03/14/2018

11/08/2017

10/13/2017

CLINICAL DATA: Multinodular goiter follow-up

EXAM:
THYROID ULTRASOUND
TECHNIQUE: Ultrasound examination of the thyroid gland and adjacent soft
tissues was performed.

[Series 1: us thyroid · 13 of 56 slices shown]
[im 1/56]
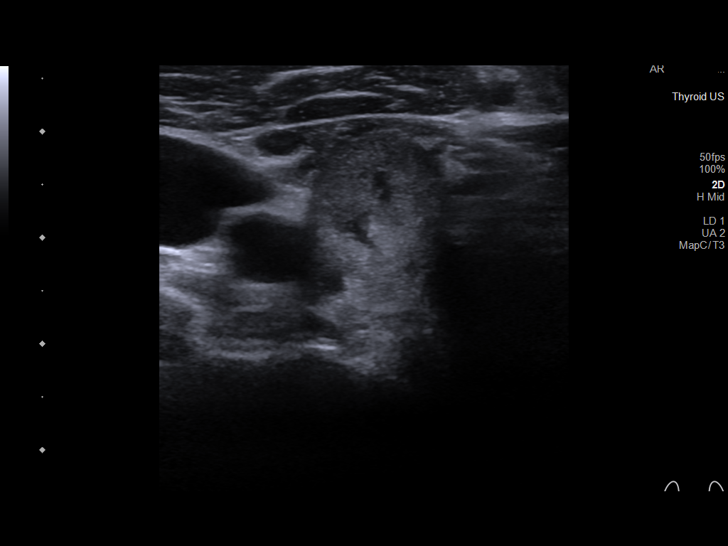
[im 5/56]
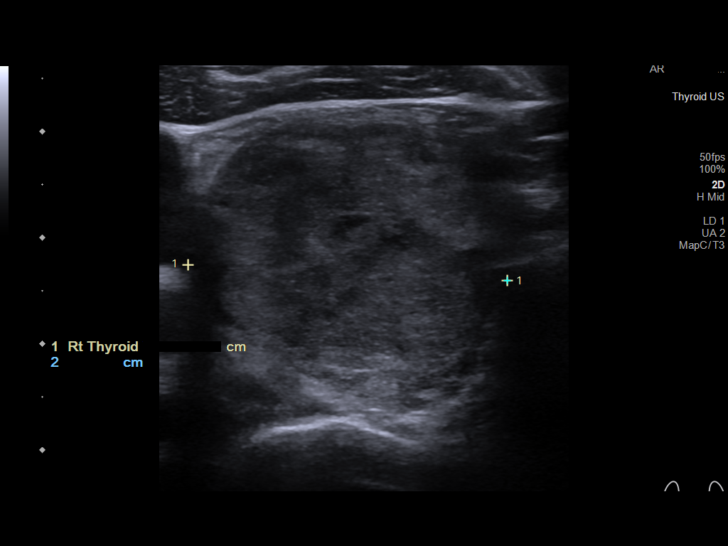
[im 10/56]
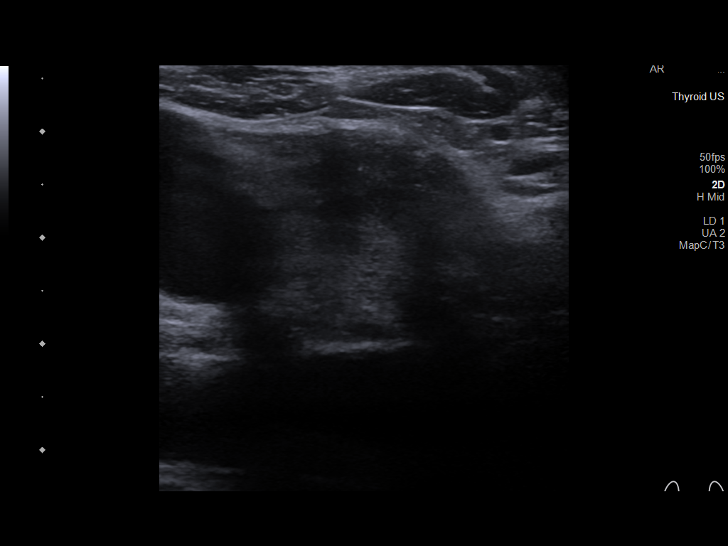
[im 14/56]
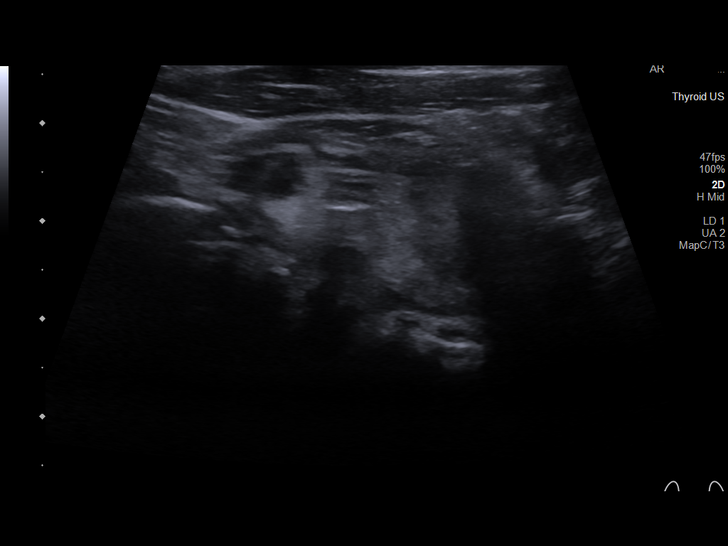
[im 19/56]
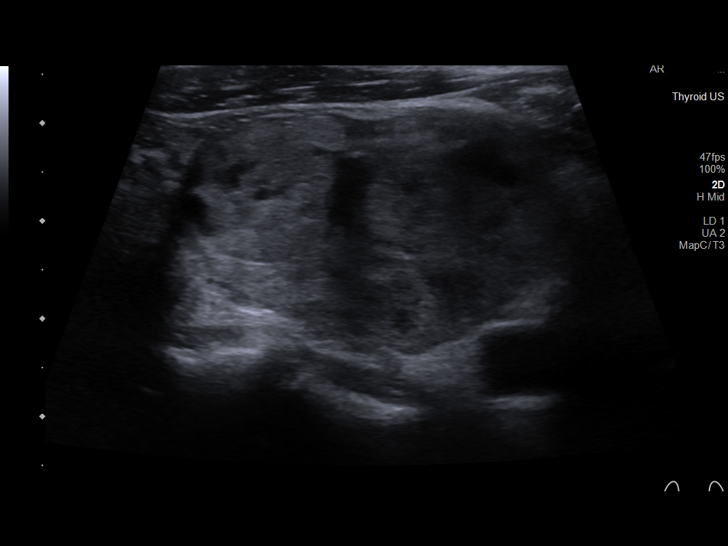
[im 23/56]
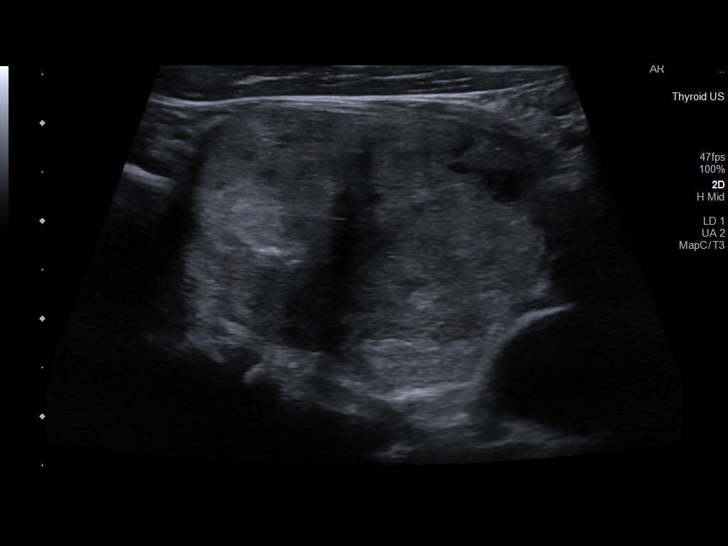
[im 28/56]
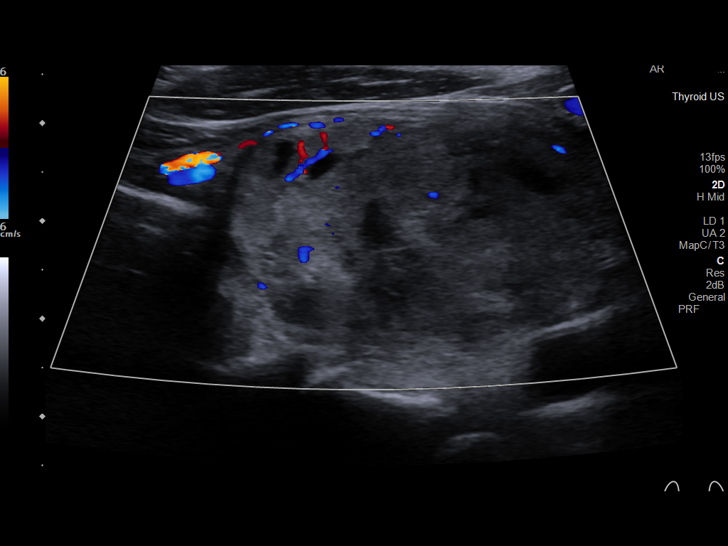
[im 33/56]
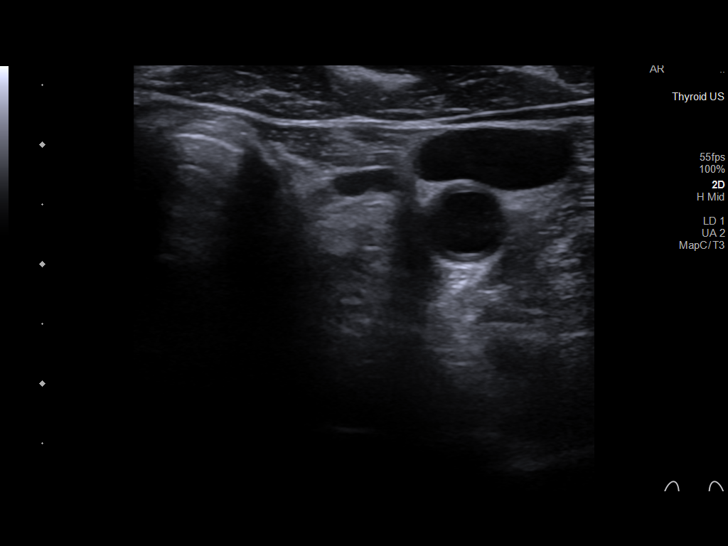
[im 37/56]
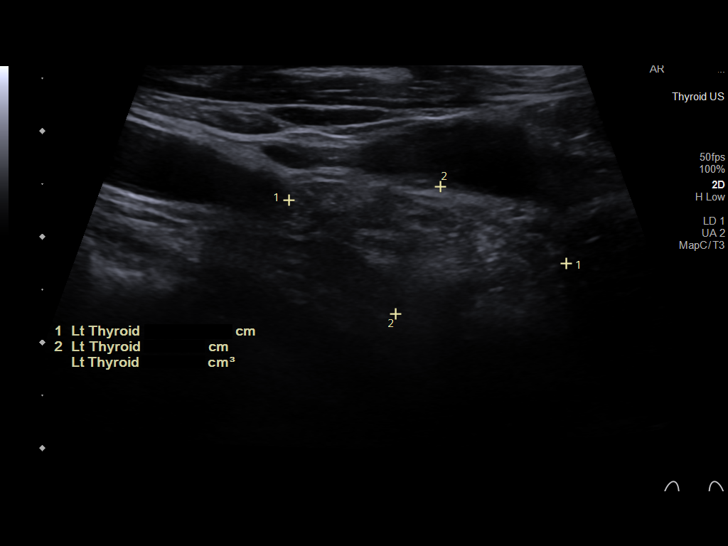
[im 42/56]
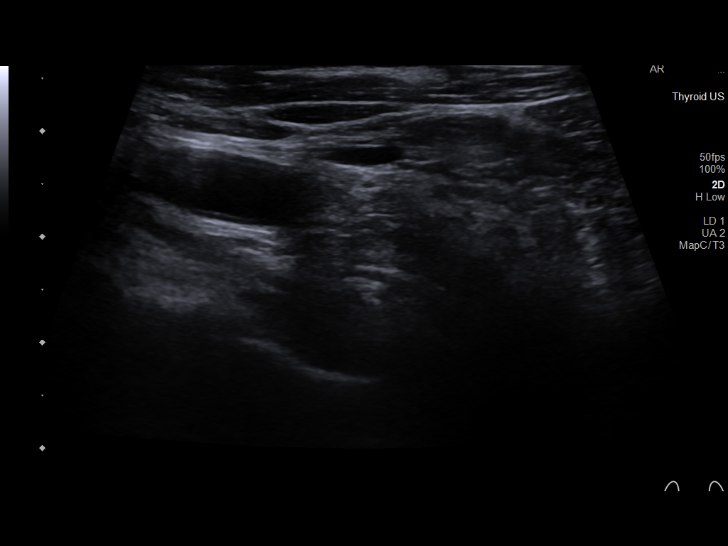
[im 46/56]
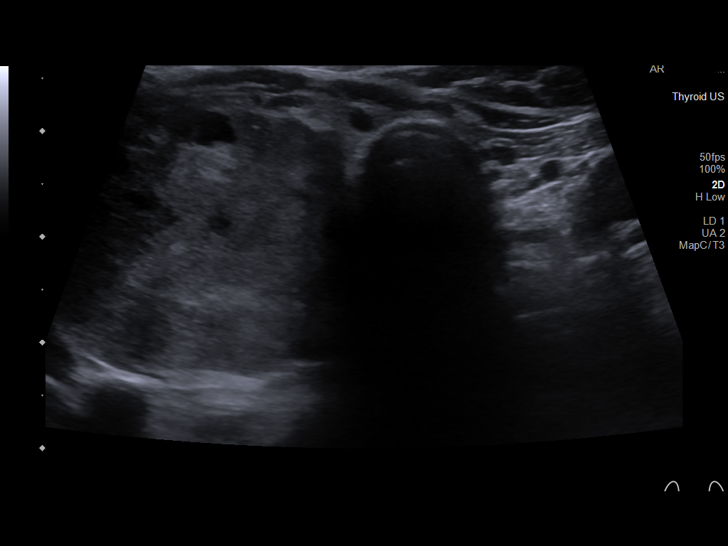
[im 51/56]
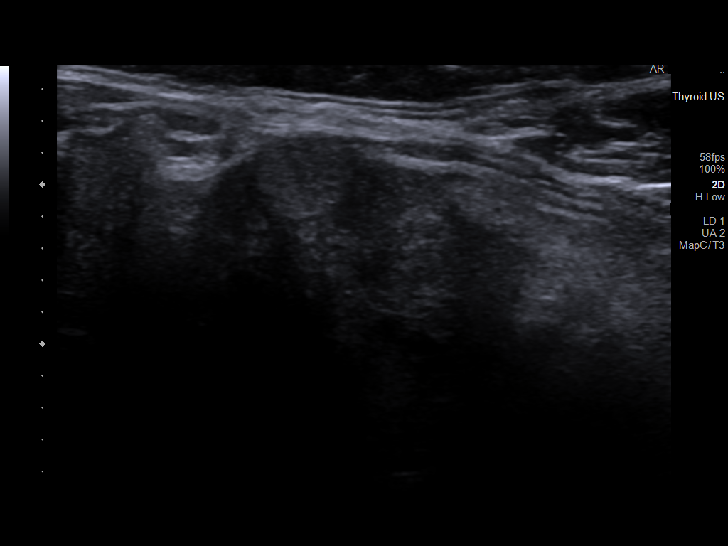
[im 56/56]
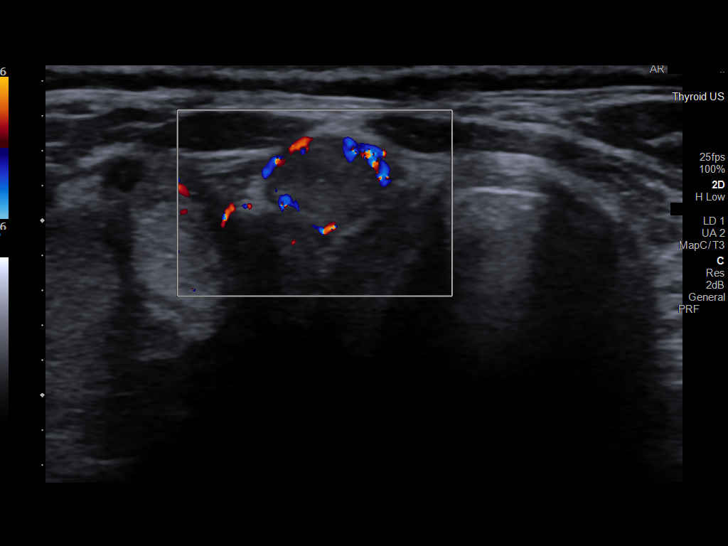

[13 of 25 positions shown; findings below may reference images not displayed]

FINDINGS: Parenchymal Echotexture: Moderately heterogeneous

Isthmus: 0.2 cm

Right lobe: 4.4 x 2.6 x 3.0 cm

Left lobe: 2.7 x 1.3 x 1.2 cm

_________________________________________________________

Estimated total number of nodules >/= 1 cm: 1

Number of spongiform nodules >/=  2 cm not described below (TR1): 0

Number of mixed cystic and solid nodules >/= 1.5 cm not described
below (TR2): 0

_________________________________________________________

Nodule # 1:

Prior biopsy: No

Location: Isthmus; right

Maximum size: 0.7 cm; Other 2 dimensions: 0.7 x 0.6 cm, previously,
1.8 x 1.2 x 1.3 cm

Composition: solid/almost completely solid (2)

Echogenicity: isoechoic (1)

Shape: not taller-than-wide (0)

Margins: ill-defined (0)

Echogenic foci: none (0)

ACR TI-RADS total points: 3.

ACR TI-RADS risk category:  TR3 (3 points).

Significant change in size (>/= 20% in two dimensions and minimal
increase of 2 mm): No

Change in features: No

Change in ACR TI-RADS risk category: No

ACR TI-RADS recommendations:

Given size (<1.4 cm) and appearance, this nodule does NOT meet
TI-RADS criteria for biopsy or dedicated follow-up.

_________________________________________________________

Nodule 2: 3.7 x 2.7 x 3.3 cm solid nodule in the inferior right
thyroid lobe is not significantly changed in size since prior exam.
FNA of this nodule was performed on 11/08/2017. Please correlate
with FNA results.

_________________________________________________________

Previously seen left thyroid nodule is not identified on the current
exam.
IMPRESSION: 1. Nodule 1 located in the isthmus does not meet criteria for FNA or
surveillance.
2. Nodule 2 located in the inferior right thyroid lobe is not
significantly changed in size since prior examination. Prior FNA
performed in 3050.

The above is in keeping with the ACR TI-RADS recommendations - [HOSPITAL] 6351;[DATE].

## 2021-12-30 LAB — TSH: TSH: 0.959 u[IU]/mL (ref 0.450–4.500)

## 2021-12-30 LAB — T4, FREE: Free T4: 1.51 ng/dL (ref 0.82–1.77)

## 2022-01-05 ENCOUNTER — Encounter: Payer: Self-pay | Admitting: "Endocrinology

## 2022-01-05 ENCOUNTER — Ambulatory Visit: Payer: Medicare PPO | Admitting: "Endocrinology

## 2022-01-05 VITALS — BP 112/64 | HR 64 | Ht 59.0 in | Wt 151.4 lb

## 2022-01-05 DIAGNOSIS — E890 Postprocedural hypothyroidism: Secondary | ICD-10-CM

## 2022-01-05 DIAGNOSIS — E042 Nontoxic multinodular goiter: Secondary | ICD-10-CM | POA: Diagnosis not present

## 2022-01-05 NOTE — Progress Notes (Signed)
? ?    ?                                           01/05/2022, 12:19 PM ?                  ?Endocrinology follow-up note ? ? ?Subjective:  ? ? Patient ID: Jody Howe, female    DOB: [birth year], PCP Jody Clarks, FNP ? ? ?Past Medical History:  ?Diagnosis Date  ? Allergic rhinitis   ? Arthritis   ? Constipation   ? Cystocele   ? mild on exam  ? Goiter   ? Medical history non-contributory   ? Osteoporosis   ? Plantar fasciitis   ? ?Past Surgical History:  ?Procedure Laterality Date  ? BASAL CELL CARCINOMA EXCISION    ? CATARACT EXTRACTION    ? COLONOSCOPY WITH PROPOFOL N/A 02/15/2016  ? Procedure: COLONOSCOPY WITH PROPOFOL;  Surgeon: Jody Luster, MD;  Location: [facility] ENDOSCOPY;  Service: Gastroenterology;  Laterality: N/A;  ? NOSE SURGERY    ? excision and flap - [facility] left nose  ? RETINAL LASER PROCEDURE  10/08  ? laster retinal surgery  ? TONSILLECTOMY    ? ?Social History  ? ?Socioeconomic History  ? Marital status: Married  ?  Spouse name: Not on file  ? Number of children: Not on file  ? Years of education: Not on file  ? Highest education level: Not on file  ?Occupational History  ? Not on file  ?Tobacco Use  ? Smoking status: Never  ? Smokeless tobacco: Never  ?Vaping Use  ? Vaping Use: Never used  ?Substance and Sexual Activity  ? Alcohol use: No  ? Drug use: No  ? Sexual activity: Not on file  ?Other Topics Concern  ? Not on file  ?Social History Narrative  ? Not on file  ? ?Social Determinants of Health  ? ?Financial Resource Strain: Not on file  ?Food Insecurity: Not on file  ?Transportation Needs: Not on file  ?Physical Activity: Not on file  ?Stress: Not on file  ?Social Connections: Not on file  ? ?Outpatient Encounter Medications as of 01/05/2022  ?Medication Sig  ? Ascorbic Acid (VITAMIN C) 500 MG CAPS Take 1 capsule by mouth 2 (two) times daily.  ? aspirin EC 81 MG tablet Take 81 mg by mouth daily as needed. Swallow whole.  ? Calcium Carb-Cholecalciferol (CALCIUM 600+D) 600-800 MG-UNIT TABS Take  1 tablet by mouth daily.  ? Cholecalciferol (VITAMIN D3) 125 MCG (5000 UT) CAPS Take 1 capsule by mouth daily.  ? denosumab (PROLIA) 60 MG/ML SOLN injection Inject 60 mg into the skin every 6 (six) months. Administer in upper arm, thigh, or abdomen  ? docusate sodium (COLACE) 100 MG capsule Take 2 capsules by mouth 2 (two) times daily.  ? Glucosamine-Chondroitin (GLUCOSAMINE CHONDR COMPLEX PO) Take 1 tablet by mouth 2 (two) times daily.  ? levothyroxine (SYNTHROID) 75 MCG tablet Take 1 tablet (75 mcg total) by mouth daily before breakfast.  ? Omega-3 Fatty Acids (FISH OIL) 600 MG CAPS Take 1 capsule by mouth daily in the afternoon. (Patient not taking: Reported on 01/05/2022)  ? Psyllium Husk POWD Take by mouth.  ? rosuvastatin (CRESTOR) 10 MG tablet Take 1 tablet by mouth daily.  ? [DISCONTINUED] Magnesium 400 MG CAPS Take 1 capsule by mouth daily.  ? ?  No facility-administered encounter medications on file as of 01/05/2022.  ? ?ALLERGIES: ?Allergies  ?Allergen Reactions  ? Ceftin [Cefuroxime Axetil] Other (See Comments)  ? Other Hives  ? Sulfa Antibiotics Hives  ? ? ?VACCINATION STATUS: ?Immunization History  ?Administered Date(s) Administered  ? Influenza-Unspecified 07/09/2014  ? ? ?HPI ?Jody Howe is 76 y.o. female who presents today with a medical history as above. 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   ? ?Her treatments were administered in February 2019 and June 2019.   ?-She is currently on levothyroxine 75 mcg p.o. daily before breakfast.   ? ? ? She reports compliance with medication.  ?She reports steady weight, no new complaints today.  She reports better consistent energy level.  She has no new complaints today. ? ?she denies family history of thyroid dysfunction. No family history of thyroid cancer. ?- Around 2013, she was found to have cold nodule on her right thyroid which reportedly  was biopsied and showed benign findings.  ? ?- Her repeat thyroid ultrasound shows decreasing size of thyroid lobes and bilateral thyroid nodules.  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 ? ?- She has history of osteoporosis on prolia treatment. ? ?Review of Systems ?Limited as above. ? ?Objective:  ?  ?BP 112/64   Pulse 64   Ht 4' 11" (1.499 m)   Wt 151 lb 6.4 oz (68.7 kg)   BMI 30.58 kg/m?   ?Wt Readings from Last 3 Encounters:  ?01/05/22 151 lb 6.4 oz (68.7 kg)  ?09/06/21 151 lb 9.6 oz (68.8 kg)  ?03/03/21 151 lb (68.5 kg)  ?  ?Physical Exam ? ? ? ?CMP  ?   ?Component Value Date/Time  ? NA 138 07/26/2007 0931  ? K 3.8 07/26/2007 0931  ? CL 106 07/26/2007 0931  ? CO2 25 07/26/2007 0931  ? GLUCOSE 92 07/26/2007 0931  ? BUN 13 07/26/2007 0931  ? CREATININE 0.61 07/26/2007 0931  ? CALCIUM 8.6 07/26/2007 0931  ? GFRNONAA >60 07/26/2007 0931  ? GFRAA  07/26/2007 0931  ?  >60        ?The eGFR has been calculated ?using the MDRD equation. ?This calculation has not been ?validated in all clinical  ? ?Review of her 2013 thyroid uptake and scan revealed a large cold nodule which was seen to arise from the inferior pole of the right lobe of the thyroid-reportedly biopsied with benign outcomes. ? Cytology reports show that they were benign with some Hurthle cells. ? ?Thyroid ultrasound from October 03, 2016 showed 4.4 cm right inferior pole solid thyroid nodule on the right lobe of the thyroid.  ?-This nodule appeared as a cold mass on the thyroid uptake and scan on October 26, 2017 which showed 43% uptake in 24 hours consistent with hyperthyroidism. ? ?Fine-needle aspiration of dominant right-sided nodule of the thyroid on November 08, 2017 ?Diagnosis ?THYROID, FINE NEEDLE ASPIRATION, RIGHT (SPECIMEN 1 OF 1 COLLECTED 11/08/2017 ?CONSISTENT WITH BENIGN FOLLICULAR NODULE (BETHESDA CATEGORY II) ? ?Recent Results (from the past 2160 hour(s))  ?T4, free     Status: None  ?  Collection Time: 12/29/21 10:08 AM  ?Result Value Ref Range  ? Free T4 1.51 0.82 - 1.77 ng/dL  ?TSH     Status: None  ? Collection Time: 12/29/21 10:08 AM  ?Result Value Ref Range  ? TSH 0.959 0.450 - 4.500 uIU/mL  ? ? ?Recent   Results (from the past 2160 hour(s))  ?T4, free     Status: None  ? Collection Time: 12/29/21 10:08 AM  ?Result Value Ref Range  ? Free T4 1.51 0.82 - 1.77 ng/dL  ?TSH     Status: None  ? Collection Time: 12/29/21 10:08 AM  ?Result Value Ref Range  ? TSH 0.959 0.450 - 4.500 uIU/mL  ? ? ?FINDINGS: ?Parenchymal Echotexture: Moderately heterogeneous ?  ?Isthmus: 0.2 cm ?  ?Right lobe: 4.4 x 2.6 x 3.0 cm ?  ?Left lobe: 2.7 x 1.3 x 1.2 cm ? ?IMPRESSION: ?1. Nodule 1 located in the isthmus does not meet criteria for FNA or ?surveillance. ?2. Nodule 2 located in the inferior right thyroid lobe is not ?significantly changed in size since prior examination. Prior FNA ?performed in 2019. ? ? ?Assessment & Plan:  ? ? ?1.  RAI induced hypothyroidism  ?-Her presentation now is consistent with appropriate replacement.  She is advised to continue levothyroxine 75 mcg p.o. daily before breakfast.   ? ? ? - We discussed about the correct intake of her thyroid hormone, on empty stomach at fasting, with water, separated by at least 30 minutes from breakfast and other medications,  and separated by more than 4 hours from calcium, iron, multivitamins, acid reflux medications (PPIs). ?-Patient is made aware of the fact that thyroid hormone replacement is needed for life, dose to be adjusted by periodic monitoring of thyroid function tests. ? ? ?2. Cold Nodule on right Lobe of Thyroid -negative biopsy for malignancy ?-Her previsit surveillance thyroid/neck ultrasound was significant for shrinking size of bilateral thyroid lobes and nodules.  She will not need intervention with biopsy or surgery at this time.   ?She may need thyroid ultrasound in 1 years to monitor the nodular size. ? ?- She wishes to follow up with  her PCP for treatment of osteoporosis currently on prolia a status post 2 doses. ? ? ?- I advised patient to maintain close follow up with Jody Clarks, FNP for primary care needs. ? ? ? ?I spent 21 minutes in th

## 2022-01-07 ENCOUNTER — Encounter: Payer: Self-pay | Admitting: *Deleted

## 2022-01-10 ENCOUNTER — Ambulatory Visit
Admission: RE | Admit: 2022-01-10 | Discharge: 2022-01-10 | Disposition: A | Discharge status: Home / Self Care | Payer: Medicare PPO | Attending: Gastroenterology | Admitting: Gastroenterology

## 2022-01-10 ENCOUNTER — Ambulatory Visit: Payer: Medicare PPO | Admitting: Anesthesiology

## 2022-01-10 ENCOUNTER — Encounter: Payer: Self-pay | Admitting: *Deleted

## 2022-01-10 ENCOUNTER — Other Ambulatory Visit: Payer: Self-pay

## 2022-01-10 ENCOUNTER — Encounter
Admission: RE | Disposition: A | Discharge status: Home / Self Care | Payer: Self-pay | Source: Home / Self Care | Attending: Gastroenterology

## 2022-01-10 DIAGNOSIS — E039 Hypothyroidism, unspecified: Secondary | ICD-10-CM | POA: Diagnosis not present

## 2022-01-10 DIAGNOSIS — K219 Gastro-esophageal reflux disease without esophagitis: Secondary | ICD-10-CM | POA: Diagnosis not present

## 2022-01-10 DIAGNOSIS — K640 First degree hemorrhoids: Secondary | ICD-10-CM | POA: Diagnosis not present

## 2022-01-10 DIAGNOSIS — D120 Benign neoplasm of cecum: Secondary | ICD-10-CM | POA: Diagnosis not present

## 2022-01-10 DIAGNOSIS — Z86010 Personal history of colonic polyps: Secondary | ICD-10-CM | POA: Insufficient documentation

## 2022-01-10 DIAGNOSIS — Z1211 Encounter for screening for malignant neoplasm of colon: Secondary | ICD-10-CM | POA: Insufficient documentation

## 2022-01-10 HISTORY — DX: Hyperlipidemia, unspecified: E78.5

## 2022-01-10 HISTORY — PX: COLONOSCOPY WITH PROPOFOL: SHX5780

## 2022-01-10 HISTORY — DX: Malignant (primary) neoplasm, unspecified: C80.1

## 2022-01-10 SURGERY — COLONOSCOPY WITH PROPOFOL
Anesthesia: General

## 2022-01-10 MED ORDER — SODIUM CHLORIDE 0.9 % IV SOLN: INTRAVENOUS | Status: DC

## 2022-01-10 MED ORDER — PROPOFOL 10 MG/ML IV BOLUS
INTRAVENOUS | Status: DC | PRN
  Administered 2022-01-10: 50 mg via INTRAVENOUS

## 2022-01-10 MED ORDER — PROPOFOL 500 MG/50ML IV EMUL
INTRAVENOUS | Status: AC
  Filled 2022-01-10: qty 50

## 2022-01-10 MED ORDER — PROPOFOL 500 MG/50ML IV EMUL
INTRAVENOUS | Status: DC | PRN
  Administered 2022-01-10: 145 ug/kg/min via INTRAVENOUS

## 2022-01-10 MED ORDER — PHENYLEPHRINE HCL (PRESSORS) 10 MG/ML IV SOLN
INTRAVENOUS | Status: AC
  Filled 2022-01-10: qty 1

## 2022-01-10 NOTE — Anesthesia Preprocedure Evaluation (Signed)
Anesthesia Evaluation  ?Patient identified by MRN, date of birth, ID band ?Patient awake ? ? ? ?Reviewed: ?Allergy & Precautions, NPO status , Patient's Chart, lab work & pertinent test results ? ?History of Anesthesia Complications ?Negative for: history of anesthetic complications ? ?Airway ?Mallampati: III ? ?TM Distance: <3 FB ?Neck ROM: full ? ? ? Dental ? ?(+) Chipped ?  ?Pulmonary ?neg pulmonary ROS, neg shortness of breath,  ?  ?Pulmonary exam normal ? ? ? ? ? ? ? Cardiovascular ?Exercise Tolerance: Good ?(-) anginanegative cardio ROS ?Normal cardiovascular exam ? ? ?  ?Neuro/Psych ? Neuromuscular disease negative psych ROS  ? GI/Hepatic ?Neg liver ROS, GERD  Controlled,  ?Endo/Other  ?Hypothyroidism  ? Renal/GU ?negative Renal ROS  ?negative genitourinary ?  ?Musculoskeletal ? ? Abdominal ?  ?Peds ? Hematology ?negative hematology ROS ?(+)   ?Anesthesia Other Findings ?Past Medical History: ?No date: Allergic rhinitis ?No date: Arthritis ?No date: Cancer [facility]) ?    Comment:  basal cell carcinoma ?No date: Constipation ?No date: Cystocele ?    Comment:  mild on exam ?No date: GERD (gastroesophageal reflux disease) ?No date: Goiter ?No date: Medical history non-contributory ?No date: Osteoporosis ?No date: Plantar fasciitis ? ?Past Surgical History: ?No date: BASAL CELL CARCINOMA EXCISION ?No date: CATARACT EXTRACTION ?02/15/2016: COLONOSCOPY WITH PROPOFOL; N/A ?    Comment:  Procedure: COLONOSCOPY WITH PROPOFOL;  Surgeon: Lupita Dawn  ?             Candace Cruise, MD;  Location: [facility] ENDOSCOPY;  Service:  ?             Gastroenterology;  Laterality: N/A; ?No date: NOSE SURGERY ?    Comment:  excision and flap - [facility] left nose ?10/08: RETINAL LASER PROCEDURE ?    Comment:  laster retinal surgery ?No date: TONSILLECTOMY ? ? ? ? Reproductive/Obstetrics ?negative OB ROS ? ?  ? ? ? ? ? ? ? ? ? ? ? ? ? ?  ?  ? ? ? ? ? ? ? ? ?Anesthesia Physical ?Anesthesia Plan ? ?ASA: 2 ? ?Anesthesia Plan: General   ? ?Post-op Pain Management:   ? ?Induction: Intravenous ? ?PONV Risk Score and Plan: Propofol infusion and TIVA ? ?Airway Management Planned: Natural Airway and Nasal Cannula ? ?Additional Equipment:  ? ?Intra-op Plan:  ? ?Post-operative Plan:  ? ?Informed Consent: I have reviewed the patients History and Physical, chart, labs and discussed the procedure including the risks, benefits and alternatives for the proposed anesthesia with the patient or authorized representative who has indicated his/her understanding and acceptance.  ? ? ? ?Dental Advisory Given ? ?Plan Discussed with: Anesthesiologist, CRNA and Surgeon ? ?Anesthesia Plan Comments: (Patient consented for risks of anesthesia including but not limited to:  ?- adverse reactions to medications ?- risk of airway placement if required ?- damage to eyes, teeth, lips or other oral mucosa ?- nerve damage due to positioning  ?- sore throat or hoarseness ?- Damage to heart, brain, nerves, lungs, other parts of body or loss of life ? ?Patient voiced understanding.)  ? ? ? ? ? ? ?Anesthesia Quick Evaluation ? ?

## 2022-01-10 NOTE — Op Note (Signed)
[facility] ?Gastroenterology ?Patient Name: Jody Howe ?Procedure Date: 01/10/2022 8:58 AM ?MRN: [id number] ?Account #: [id number] ?Date of Birth: August 24, 1946 ?Admit Type: Outpatient ?Age: 76 ?Room: [facility] ENDO ROOM 3 ?Gender: Female ?Note Status: Finalized ?Instrument Name: Colonscope [id number] ?Procedure:             Colonoscopy ?Indications:           High risk colon cancer surveillance: Personal history  ?                       of adenoma (10 mm or greater in size) ?Providers:             Andrey Farmer MD, MD ?Referring MD:          Forest Gleason Md, MD (Referring MD) ?Medicines:             Monitored Anesthesia Care ?Complications:         No immediate complications. Estimated blood loss:  ?                       Minimal. ?Procedure:             Pre-Anesthesia Assessment: ?                       - Prior to the procedure, a History and Physical was  ?                       performed, and patient medications and allergies were  ?                       reviewed. The patient is competent. The risks and  ?                       benefits of the procedure and the sedation options and  ?                       risks were discussed with the patient. All questions  ?                       were answered and informed consent was obtained.  ?                       Patient identification and proposed procedure were  ?                       verified by the physician, the nurse, the  ?                       anesthesiologist, the anesthetist and the technician  ?                       in the endoscopy suite. Mental Status Examination:  ?                       alert and oriented. Airway Examination: normal  ?                       oropharyngeal airway and neck mobility. Respiratory  ?  Examination: clear to auscultation. CV Examination:  ?                       normal. Prophylactic Antibiotics: The patient does not  ?                       require prophylactic antibiotics. Prior  ?                        Anticoagulants: The patient has taken no previous  ?                       anticoagulant or antiplatelet agents. ASA Grade  ?                       Assessment: II - A patient with mild systemic disease.  ?                       After reviewing the risks and benefits, the patient  ?                       was deemed in satisfactory condition to undergo the  ?                       procedure. The anesthesia plan was to use monitored  ?                       anesthesia care (MAC). Immediately prior to  ?                       administration of medications, the patient was  ?                       re-assessed for adequacy to receive sedatives. The  ?                       heart rate, respiratory rate, oxygen saturations,  ?                       blood pressure, adequacy of pulmonary ventilation, and  ?                       response to care were monitored throughout the  ?                       procedure. The physical status of the patient was  ?                       re-assessed after the procedure. ?                       After obtaining informed consent, the colonoscope was  ?                       passed under direct vision. Throughout the procedure,  ?                       the patient's blood pressure, pulse, and oxygen  ?  saturations were monitored continuously. The  ?                       Colonoscope was introduced through the anus and  ?                       advanced to the the cecum, identified by appendiceal  ?                       orifice and ileocecal valve. The colonoscopy was  ?                       performed without difficulty. The patient tolerated  ?                       the procedure well. The quality of the bowel  ?                       preparation was good. ?Findings: ?     The perianal and digital rectal examinations were normal. ?     A 3 mm polyp was found in the cecum. The polyp was sessile. The polyp  ?     was removed with a cold snare. Resection and  retrieval were complete.  ?     Estimated blood loss was minimal. ?     Two sessile polyps were found in the cecum. The polyps were 1 to 2 mm in  ?     size. These polyps were removed with a jumbo cold forceps. Resection and  ?     retrieval were complete. Estimated blood loss was minimal. ?     A tattoo was seen in the sigmoid colon. The tattoo site appeared normal. ?     Internal hemorrhoids were found during retroflexion. The hemorrhoids  ?     were Grade I (internal hemorrhoids that do not prolapse). ?     The exam was otherwise without abnormality on direct and retroflexion  ?     views. ?Impression:            - One 3 mm polyp in the cecum, removed with a cold  ?                       snare. Resected and retrieved. ?                       - Two 1 to 2 mm polyps in the cecum, removed with a  ?                       jumbo cold forceps. Resected and retrieved. ?                       - A tattoo was seen in the sigmoid colon. The tattoo  ?                       site appeared normal. ?                       - Internal hemorrhoids. ?                       -  The examination was otherwise normal on direct and  ?                       retroflexion views. ?Recommendation:        - Discharge patient to home. ?                       - Resume previous diet. ?                       - Continue present medications. ?                       - Await pathology results. ?                       - Repeat colonoscopy in 3 - 5 years for surveillance. ?                       - Return to referring physician as previously  ?                       scheduled. ?Procedure Code(s):     --- Professional --- ?                       [phone], Colonoscopy, flexible; with removal of  ?                       tumor(s), polyp(s), or other lesion(s) by snare  ?                       technique ?                       45380, 59, Colonoscopy, flexible; with biopsy, single  ?                       or multiple ?Diagnosis Code(s):     --- Professional --- ?                        K63.5, Polyp of colon ?                       Z86.010, Personal history of colonic polyps ?                       K64.0, First degree hemorrhoids ?CPT copyright 2019 American Medical Association. All rights reserved. ?The codes documented in this report are preliminary and upon coder review may  ?be revised to meet current compliance requirements. ?Andrey Farmer MD, MD ?01/10/2022 9:42:34 AM ?Number of Addenda: 0 ?Note Initiated On: 01/10/2022 8:58 AM ?Scope Withdrawal Time: 0 hours 11 minutes 4 seconds  ?Total Procedure Duration: 0 hours 19 minutes 10 seconds  ?Estimated Blood Loss:  Estimated blood loss was minimal. ?     [facility] ?

## 2022-01-10 NOTE — H&P (Signed)
Outpatient short stay form Pre-procedure ?01/10/2022  ?Jody Rubenstein, MD ? ?Primary Physician: Jody Clarks, FNP ? ?Reason for visit:  Surveillance ? ?History of present illness:   ? ?76 y/o lady with history of hypothyroidism here for surveillance colonoscopy. Had large polyp in 2013 per note but on subsequent colonoscopies has had no significant findings. No blood thinners. No family history of GI malignancies. No significant abdominal surgeries. ? ? ? ?Current Facility-Administered Medications:  ?  0.9 %  sodium chloride infusion, , Intravenous, Continuous, Marguetta Howe, Jody Cork, MD, Last Rate: 20 mL/hr at 01/10/22 0859, New Bag at 01/10/22 0859 ? ?Medications Prior to Admission  ?Medication Sig Dispense Refill Last Dose  ? levothyroxine (SYNTHROID) 75 MCG tablet Take 1 tablet (75 mcg total) by mouth daily before breakfast. 90 tablet 1 01/10/2022  ? rosuvastatin (CRESTOR) 10 MG tablet Take 1 tablet by mouth daily.   01/09/2022  ? Ascorbic Acid (VITAMIN C) 500 MG CAPS Take 1 capsule by mouth 2 (two) times daily.     ? aspirin EC 81 MG tablet Take 81 mg by mouth daily as needed. Swallow whole.     ? Calcium Carb-Cholecalciferol (CALCIUM 600+D) 600-800 MG-UNIT TABS Take 1 tablet by mouth daily.     ? Cholecalciferol (VITAMIN D3) 125 MCG (5000 UT) CAPS Take 1 capsule by mouth daily.     ? denosumab (PROLIA) 60 MG/ML SOLN injection Inject 60 mg into the skin every 6 (six) months. Administer in upper arm, thigh, or abdomen     ? docusate sodium (COLACE) 100 MG capsule Take 2 capsules by mouth 2 (two) times daily.     ? Glucosamine-Chondroitin (GLUCOSAMINE CHONDR COMPLEX PO) Take 1 tablet by mouth 2 (two) times daily.     ? Omega-3 Fatty Acids (FISH OIL) 600 MG CAPS Take 1 capsule by mouth daily in the afternoon. (Patient not taking: Reported on 01/05/2022)     ? Psyllium Husk POWD Take by mouth.     ? ? ? ?Allergies  ?Allergen Reactions  ? Ceftin [Cefuroxime Axetil] Other (See Comments)  ? Other Hives  ? Sulfa  Antibiotics Hives  ? ? ? ?Past Medical History:  ?Diagnosis Date  ? Allergic rhinitis   ? Arthritis   ? Cancer [facility])   ? basal cell carcinoma  ? Constipation   ? Cystocele   ? mild on exam  ? GERD (gastroesophageal reflux disease)   ? Goiter   ? Hyperlipidemia   ? Medical history non-contributory   ? Osteoporosis   ? Plantar fasciitis   ? ? ?Review of systems:  Otherwise negative.  ? ? ?Physical Exam ? ?Gen: Alert, oriented. Appears stated age.  ?HEENT: PERRLA. ?Lungs: No respiratory distress ?CV: RRR ?Abd: soft, benign, no masses ?Ext: No edema ? ? ? ?Planned procedures: Proceed with colonoscopy. The patient understands the nature of the planned procedure, indications, risks, alternatives and potential complications including but not limited to bleeding, infection, perforation, damage to internal organs and possible oversedation/side effects from anesthesia. The patient agrees and gives consent to proceed.  ?Please refer to procedure notes for findings, recommendations and patient disposition/instructions.  ? ? ? ?Jody Rubenstein, MD ?[facility] ? ? ? ?  ? ?

## 2022-01-10 NOTE — Transfer of Care (Signed)
Immediate Anesthesia Transfer of Care Note ? ?Patient: Jody Howe ? ?Procedure(s) Performed: COLONOSCOPY WITH PROPOFOL ? ?Patient Location: PACU ? ?Anesthesia Type:General ? ?Level of Consciousness: awake and alert  ? ?Airway & Oxygen Therapy: Patient Spontanous Breathing and Patient connected to nasal cannula oxygen ? ?Post-op Assessment: Report given to RN and Post -op Vital signs reviewed and stable ? ?Post vital signs: Reviewed and stable ? ?Last Vitals:  ?Vitals Value Taken Time  ?BP 94/46 01/10/22 0938  ?Temp    ?Pulse 58 01/10/22 0937  ?Resp 17 01/10/22 0938  ?SpO2 100 % 01/10/22 0937  ?Vitals shown include unvalidated device data. ? ?Last Pain:  ?Vitals:  ? 01/10/22 0930  ?TempSrc: Temporal  ?PainSc:   ?   ? ?  ? ?Complications: No notable events documented. ?

## 2022-01-10 NOTE — Interval H&P Note (Signed)
History and Physical Interval Note: ? ?01/10/2022 ?9:07 AM ? ?Jody Howe  has presented today for surgery, with the diagnosis of hx of colon polyp.  The various methods of treatment have been discussed with the patient and family. After consideration of risks, benefits and other options for treatment, the patient has consented to  Procedure(s): ?COLONOSCOPY WITH PROPOFOL (N/A) as a surgical intervention.  The patient's history has been reviewed, patient examined, no change in status, stable for surgery.  I have reviewed the patient's chart and labs.  Questions were answered to the patient's satisfaction.   ? ? ?Hilton Cork Danny Yackley ? ?Ok to proceed with colonoscopy ?

## 2022-01-10 NOTE — Anesthesia Postprocedure Evaluation (Signed)
Anesthesia Post Note ? ?Patient: Jody Howe ? ?Procedure(s) Performed: COLONOSCOPY WITH PROPOFOL ? ?Patient location during evaluation: Endoscopy ?Anesthesia Type: General ?Level of consciousness: awake and alert ?Pain management: pain level controlled ?Vital Signs Assessment: post-procedure vital signs reviewed and stable ?Respiratory status: spontaneous breathing, nonlabored ventilation, respiratory function stable and patient connected to nasal cannula oxygen ?Cardiovascular status: blood pressure returned to baseline and stable ?Postop Assessment: no apparent nausea or vomiting ?Anesthetic complications: no ? ? ?No notable events documented. ? ? ?Last Vitals:  ?Vitals:  ? 01/10/22 0940 01/10/22 0950  ?BP: (!) 94/46 99/60  ?Pulse: (!) 58 (!) 58  ?Resp: 15 16  ?Temp:    ?SpO2: 100% 96%  ?  ?Last Pain:  ?Vitals:  ? 01/10/22 0930  ?TempSrc: Temporal  ?PainSc:   ? ? ?  ?  ?  ?  ?  ?  ? ?[name] ? ? ? ? ?

## 2022-01-11 ENCOUNTER — Encounter: Payer: Self-pay | Admitting: Gastroenterology

## 2022-01-11 LAB — SURGICAL PATHOLOGY

## 2022-02-24 IMAGING — MG MM DIGITAL SCREENING BILAT W/ TOMO AND CAD
8 series · 9 of 24 positions shown · non-contrast
Comparison: Previous exam(s).

CLINICAL DATA: Screening.

EXAM:
DIGITAL SCREENING BILATERAL MAMMOGRAM WITH TOMOSYNTHESIS AND CAD
TECHNIQUE: Bilateral screening digital craniocaudal and mediolateral oblique
mammograms were obtained. Bilateral screening digital breast
tomosynthesis was performed. The images were evaluated with
computer-aided detection.

[L MLO synth-2D]
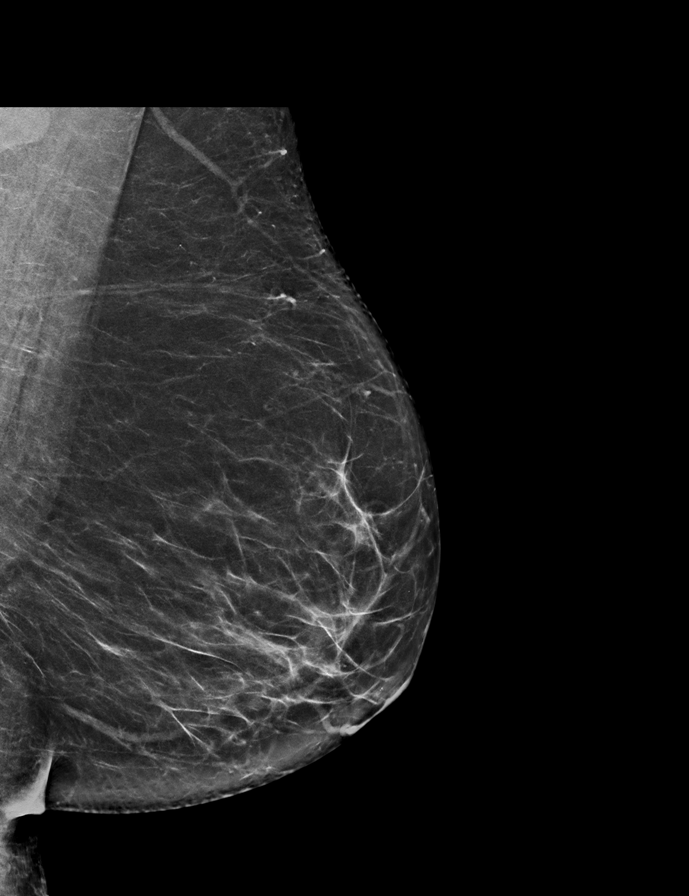

[R MLO synth-2D]
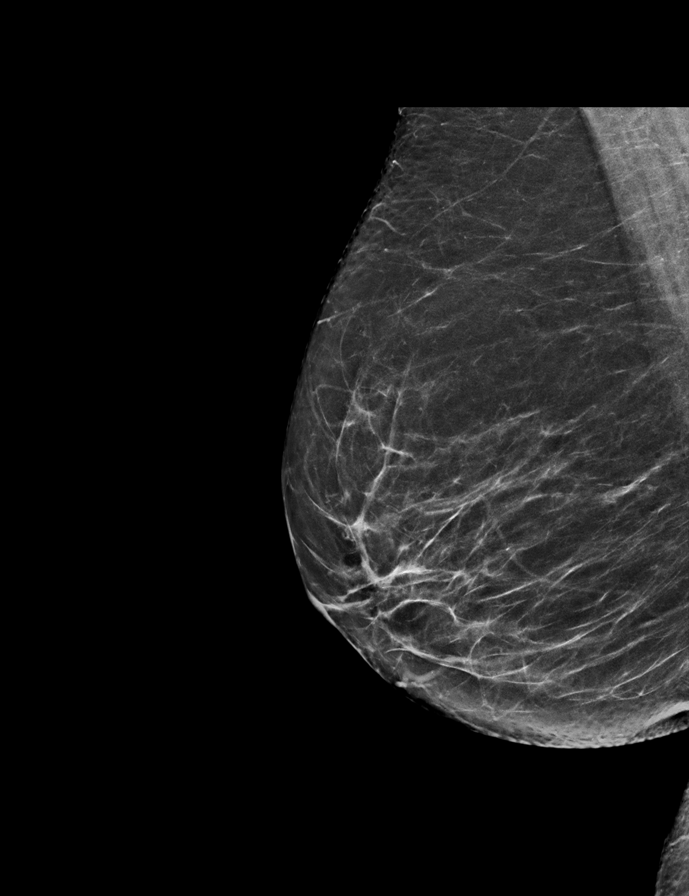

[R CC synth-2D]
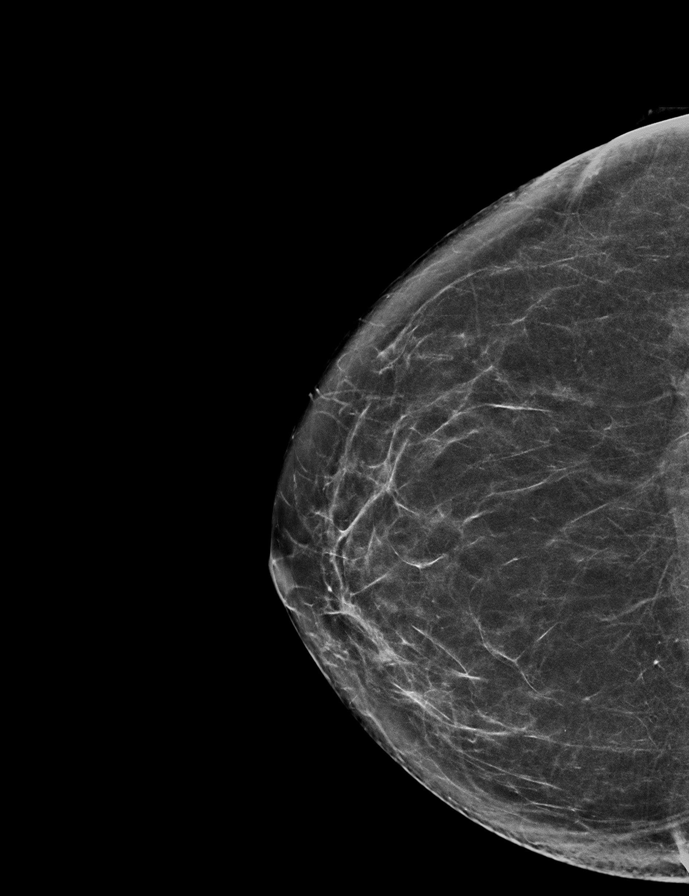

[L CC synth-2D]
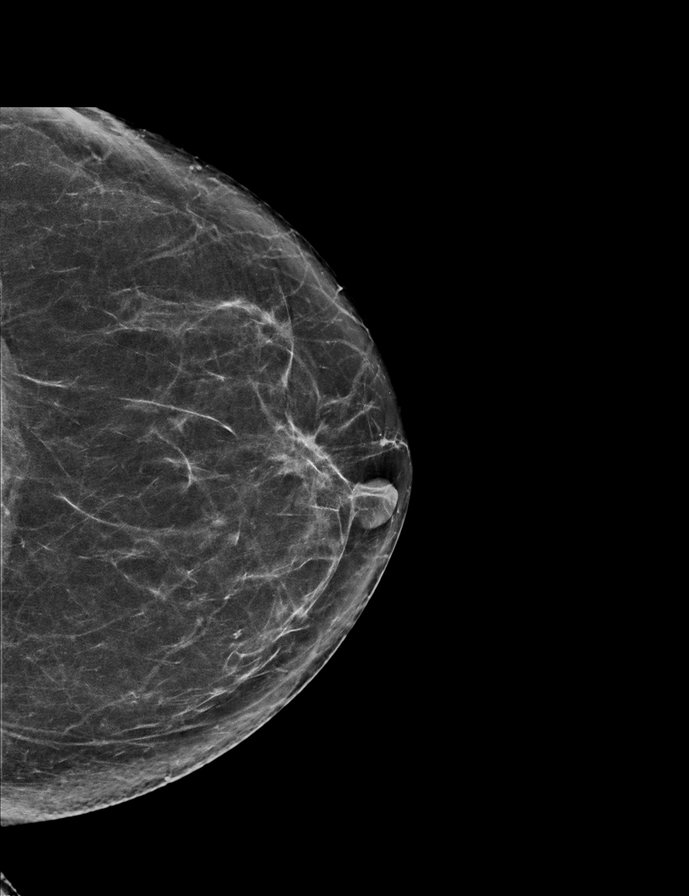

[L CC tomo · 2 of 68 frames shown]
[frame 22/68]
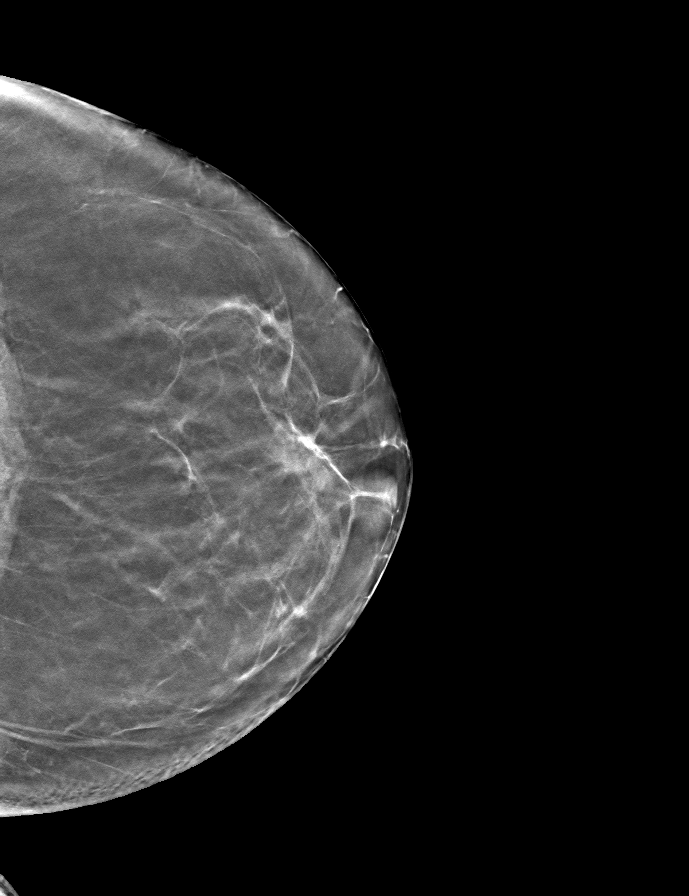
[frame 35/68]
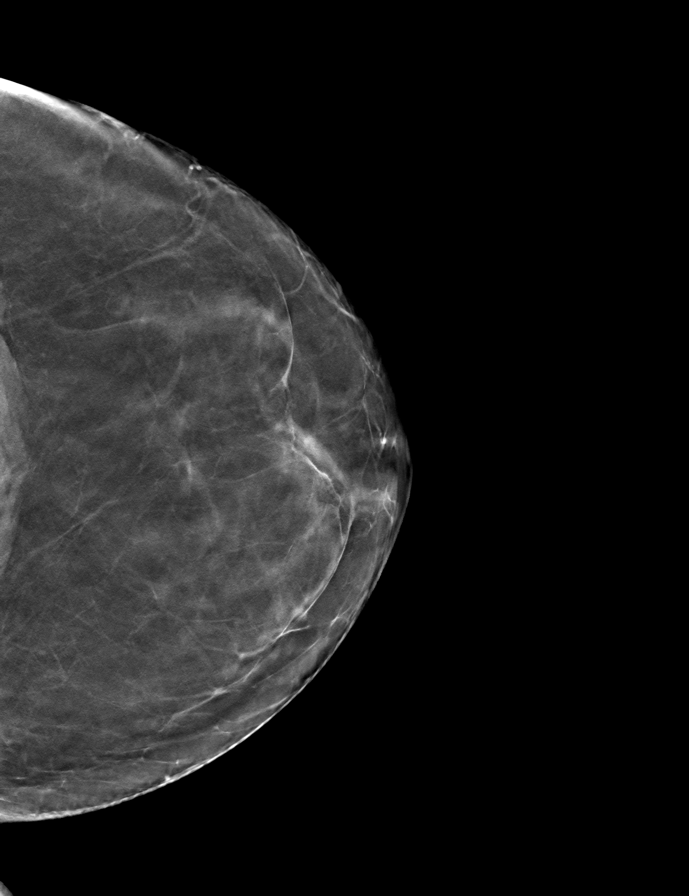

[R CC tomo · tomo slice 33/64.0]
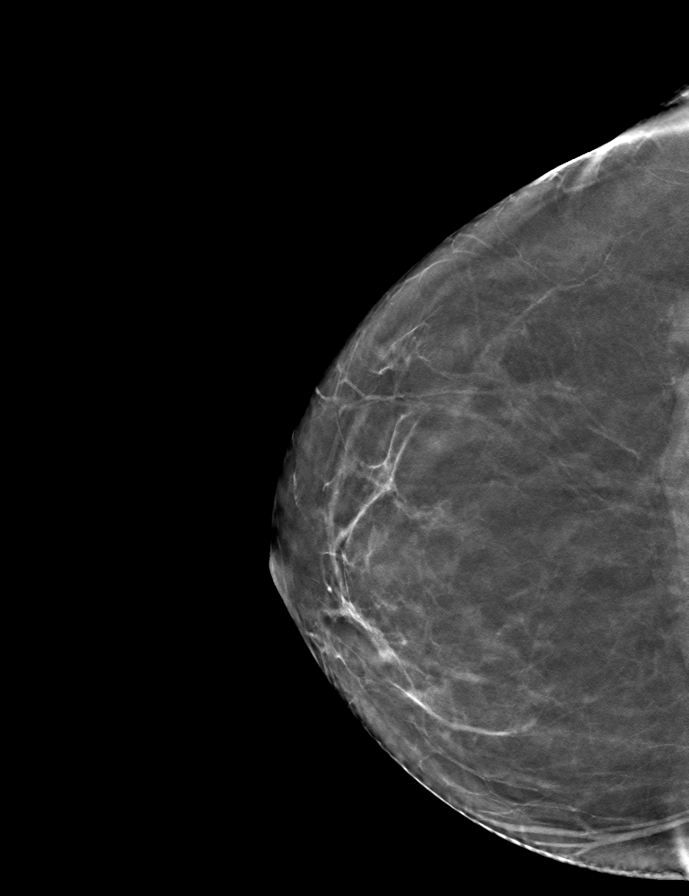

[R MLO tomo · tomo slice 33/64.0]
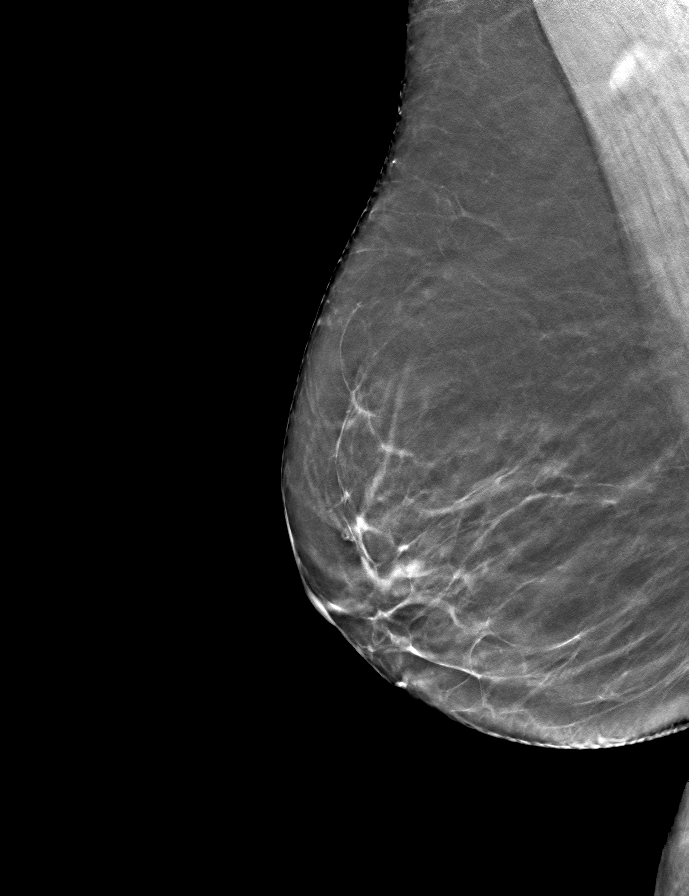

[L MLO tomo · tomo slice 35/68.0]
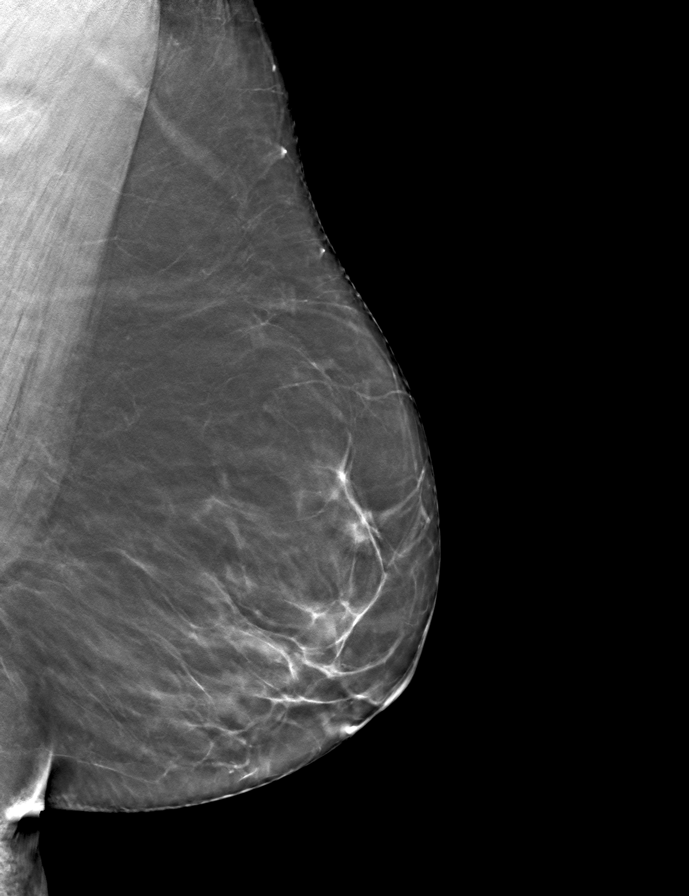

[9 of 24 positions shown; findings below may reference images not displayed]

ACR Breast Density Category b: There are scattered areas of
fibroglandular density.
FINDINGS: There are no findings suspicious for malignancy.
IMPRESSION: No mammographic evidence of malignancy. A result letter of this
screening mammogram will be mailed directly to the patient.

RECOMMENDATION:
Screening mammogram in one year. (Code:51-O-LD2)

BI-RADS CATEGORY  1: Negative.

## 2022-04-05 ENCOUNTER — Other Ambulatory Visit: Payer: Self-pay | Admitting: "Endocrinology

## 2022-07-01 ENCOUNTER — Other Ambulatory Visit (HOSPITAL_COMMUNITY): Payer: Self-pay | Admitting: Family Medicine

## 2022-07-01 DIAGNOSIS — Z1231 Encounter for screening mammogram for malignant neoplasm of breast: Secondary | ICD-10-CM

## 2022-07-01 LAB — TSH: TSH: 3.09 u[IU]/mL (ref 0.450–4.500)

## 2022-07-01 LAB — T4, FREE: Free T4: 1.34 ng/dL (ref 0.82–1.77)

## 2022-07-07 ENCOUNTER — Ambulatory Visit (INDEPENDENT_AMBULATORY_CARE_PROVIDER_SITE_OTHER): Payer: Medicare PPO | Admitting: "Endocrinology

## 2022-07-07 ENCOUNTER — Encounter: Payer: Self-pay | Admitting: "Endocrinology

## 2022-07-07 VITALS — BP 114/68 | HR 64 | Ht 59.0 in | Wt 149.2 lb

## 2022-07-07 DIAGNOSIS — E890 Postprocedural hypothyroidism: Secondary | ICD-10-CM

## 2022-07-07 DIAGNOSIS — E042 Nontoxic multinodular goiter: Secondary | ICD-10-CM | POA: Diagnosis not present

## 2022-07-07 MED ORDER — LEVOTHYROXINE SODIUM 75 MCG PO TABS: ORAL_TABLET | ORAL | 3 refills | Status: DC

## 2022-07-07 NOTE — Progress Notes (Signed)
07/07/2022, 10:02 AM                   Endocrinology follow-up note   Subjective:    Patient ID: Jody Howe, female    DOB: [birth year], PCP [name], FNP   Past Medical History:  Diagnosis Date   Allergic rhinitis    Arthritis    Cancer ([place])    basal cell carcinoma   Constipation    Cystocele    mild on exam   GERD (gastroesophageal reflux disease)    Goiter    Hyperlipidemia    Medical history non-contributory    Osteoporosis    Plantar fasciitis    Past Surgical History:  Procedure Laterality Date   BASAL CELL CARCINOMA EXCISION     CATARACT EXTRACTION     COLONOSCOPY WITH PROPOFOL N/A 02/15/2016   Procedure: COLONOSCOPY WITH PROPOFOL;  Surgeon: Hulen Luster, MD;  Location: [facility] ENDOSCOPY;  Service: Gastroenterology;  Laterality: N/A;   COLONOSCOPY WITH PROPOFOL N/A 01/10/2022   Procedure: COLONOSCOPY WITH PROPOFOL;  Surgeon: Lesly Rubenstein, MD;  Location: [facility] ENDOSCOPY;  Service: Endoscopy;  Laterality: N/A;   NOSE SURGERY     excision and flap - [facility] left nose   RETINAL LASER PROCEDURE  10/08   laster retinal surgery   TONSILLECTOMY     Social History   Socioeconomic History   Marital status: Married    Spouse name: Not on file   Number of children: Not on file   Years of education: Not on file   Highest education level: Not on file  Occupational History   Not on file  Tobacco Use   Smoking status: Never   Smokeless tobacco: Never  Vaping Use   Vaping Use: Never used  Substance and Sexual Activity   Alcohol use: No   Drug use: No   Sexual activity: Not on file  Other Topics Concern   Not on file  Social History Narrative   Not on file   Social Determinants of Health   Financial Resource Strain: Not on file  Food Insecurity: Not on file  Transportation Needs: Not on file  Physical Activity: Not on file  Stress: Not on file  Social Connections: Not on file   Outpatient  Encounter Medications as of 07/07/2022  Medication Sig   Ascorbic Acid (VITAMIN C) 500 MG CAPS Take 1 capsule by mouth 2 (two) times daily. (Patient not taking: Reported on 07/07/2022)   aspirin EC 81 MG tablet Take 81 mg by mouth daily as needed. Swallow whole. (Patient not taking: Reported on 07/07/2022)   Calcium Carb-Cholecalciferol (CALCIUM 600+D) 600-800 MG-UNIT TABS Take 1 tablet by mouth daily.   Cholecalciferol (VITAMIN D3) 125 MCG (5000 UT) CAPS Take 1 capsule by mouth daily.   denosumab (PROLIA) 60 MG/ML SOLN injection Inject 60 mg into the skin every 6 (six) months. Administer in upper arm, thigh, or abdomen   docusate sodium (COLACE) 100 MG capsule Take 2 capsules by mouth 2 (two) times daily.   Glucosamine-Chondroitin (GLUCOSAMINE CHONDR COMPLEX PO) Take 1 tablet by mouth 2 (two) times daily.   levothyroxine (SYNTHROID) 75 MCG tablet TAKE (1) TABLET BY MOUTH DAILY BEFORE BREAKFAST.  Psyllium Husk POWD Take by mouth.   rosuvastatin (CRESTOR) 10 MG tablet Take 1 tablet by mouth daily.   [DISCONTINUED] levothyroxine (SYNTHROID) 75 MCG tablet TAKE (1) TABLET BY MOUTH DAILY BEFORE BREAKFAST.   [DISCONTINUED] Omega-3 Fatty Acids (FISH OIL) 600 MG CAPS Take 1 capsule by mouth daily in the afternoon. (Patient not taking: Reported on 01/05/2022)   No facility-administered encounter medications on file as of 07/07/2022.   ALLERGIES: Allergies  Allergen Reactions   Ceftin [Cefuroxime Axetil] Other (See Comments)   Other Hives   Sulfa Antibiotics Hives    VACCINATION STATUS: Immunization History  Administered Date(s) Administered   Influenza-Unspecified 07/09/2014    HPI Jody Howe is 76 y.o. female who presents today with a medical history as above. 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    Her treatments were administered in February 2019 and June 2019.   -She is  currently on levothyroxine 75 mcg p.o. daily before breakfast.    She reports compliance with medication.  She reports steady weight, no new complaints today.  She reports better consistent energy level.  She has no new complaints today.  she denies family history of thyroid dysfunction. No family history of thyroid cancer. - Around 2013, she was found to have cold nodule on her right thyroid which reportedly was biopsied and showed benign findings.   - Her repeat thyroid ultrasound shows decreasing size of thyroid lobes and bilateral thyroid nodules.  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  - She has history of osteoporosis on prolia treatment.  Review of Systems Limited as above.  Objective:    BP 114/68   Pulse 64   Ht 4' 11" (1.499 m)   Wt 149 lb 3.2 oz (67.7 kg)   BMI 30.13 kg/m   Wt Readings from Last 3 Encounters:  07/07/22 149 lb 3.2 oz (67.7 kg)  01/10/22 141 lb (64 kg)  01/05/22 151 lb 6.4 oz (68.7 kg)    Physical Exam    CMP     Component Value Date/Time   NA 138 07/26/2007 0931   K 3.8 07/26/2007 0931   CL 106 07/26/2007 0931   CO2 25 07/26/2007 0931   GLUCOSE 92 07/26/2007 0931   BUN 13 07/26/2007 0931   CREATININE 0.61 07/26/2007 0931   CALCIUM 8.6 07/26/2007 0931   GFRNONAA >60 07/26/2007 0931   GFRAA  07/26/2007 0931    >60        The eGFR has been calculated using the MDRD equation. This calculation has not been validated in all clinical   Review of her 2013 thyroid uptake and scan revealed a large cold nodule which was seen to arise from the inferior pole of the right lobe of the thyroid-reportedly biopsied with benign outcomes.  Cytology reports show that they were benign with some Hurthle cells.  Thyroid ultrasound from October 03, 2016 showed 4.4 cm right inferior pole solid thyroid nodule on the right lobe of the thyroid.  -This nodule appeared as a cold mass on the thyroid uptake and scan on  October 26, 2017 which showed 43% uptake in 24 hours consistent with hyperthyroidism.  Fine-needle aspiration of dominant right-sided nodule of the thyroid on November 08, 2017 Diagnosis THYROID, FINE NEEDLE ASPIRATION, RIGHT (SPECIMEN 1 OF 1 COLLECTED 11/08/2017 CONSISTENT WITH BENIGN FOLLICULAR NODULE (BETHESDA CATEGORY II)  Recent Results (from the past 2160 hour(s))  T4, free     Status: None   Collection Time: 06/30/22 11:11 AM  Result Value Ref Range   Free T4 1.34 0.82 - 1.77 ng/dL  TSH     Status: None   Collection Time: 06/30/22 11:11 AM  Result Value Ref Range   TSH 3.090 0.450 - 4.500 uIU/mL    Recent Results (from the past 2160 hour(s))  T4, free     Status: None   Collection Time: 06/30/22 11:11 AM  Result Value Ref Range   Free T4 1.34 0.82 - 1.77 ng/dL  TSH     Status: None   Collection Time: 06/30/22 11:11 AM  Result Value Ref Range   TSH 3.090 0.450 - 4.500 uIU/mL    FINDINGS: Parenchymal Echotexture: Moderately heterogeneous   Isthmus: 0.2 cm   Right lobe: 4.4 x 2.6 x 3.0 cm   Left lobe: 2.7 x 1.3 x 1.2 cm  IMPRESSION: 1. Nodule 1 located in the isthmus does not meet criteria for FNA or surveillance. 2. Nodule 2 located in the inferior right thyroid lobe is not significantly changed in size since prior examination. Prior FNA performed in 2019.   Assessment & Plan:    1.  RAI induced hypothyroidism  -Her presentation now is consistent with appropriate replacement.  She is advised to continue levothyroxine 75 mcg p.o. daily before breakfast.     - We discussed about the correct intake of her thyroid hormone, on empty stomach at fasting, with water, separated by at least 30 minutes from breakfast and other medications,  and separated by more than 4 hours from calcium, iron, multivitamins, acid reflux medications (PPIs). -Patient is made aware of the fact that thyroid hormone replacement is needed for life, dose to be adjusted by periodic monitoring  of thyroid function tests.    2. Cold Nodule on right Lobe of Thyroid -negative biopsy for malignancy -Her previsit surveillance thyroid/neck ultrasound was significant for shrinking size of bilateral thyroid lobes and nodules.  She will not need intervention with biopsy or surgery at this time.   She be considered for repeat thyroid ultrasound before her next visit in 1 year.     - She wishes to follow up with her PCP for treatment of osteoporosis currently on prolia a status post 2 doses.   - I advised patient to maintain close follow up with [name], FNP for primary care needs.    I spent 21 minutes in the care of the patient today including review of labs from Thyroid Function, CMP, and other relevant labs ; imaging/biopsy records (current and previous including abstractions from other facilities); face-to-face time discussing  her lab results and symptoms, medications doses, her options of short and long term treatment based on the latest standards of care / guidelines;   and documenting the encounter.  Jody Howe  participated in the discussions, expressed understanding, and voiced agreement with the above plans.  All questions were answered to her satisfaction. she is encouraged to contact clinic should she have any questions or concerns prior to her return visit.    Follow up plan: Return in about 1 year (around 07/08/2023) for F/U with Pre-visit Labs, Thyroid / Neck Ultrasound.   Glade Lloyd, MD [facility] [address] [place], [place] [address] Phone: [phone]  Fax: [phone]     07/07/2022, 10:02 AM  This note was partially dictated with voice recognition software. Similar sounding words can be transcribed inadequately  or may not  be corrected upon review.

## 2022-08-01 ENCOUNTER — Ambulatory Visit (HOSPITAL_COMMUNITY)
Admission: RE | Admit: 2022-08-01 | Discharge: 2022-08-01 | Disposition: A | Discharge status: Home / Self Care | Payer: Medicare PPO | Source: Ambulatory Visit | Attending: Family Medicine | Admitting: Family Medicine

## 2022-08-01 DIAGNOSIS — Z1231 Encounter for screening mammogram for malignant neoplasm of breast: Secondary | ICD-10-CM

## 2023-06-22 ENCOUNTER — Other Ambulatory Visit: Payer: Self-pay | Admitting: "Endocrinology

## 2023-06-26 ENCOUNTER — Ambulatory Visit (HOSPITAL_COMMUNITY)
Admission: RE | Admit: 2023-06-26 | Discharge: 2023-06-26 | Disposition: A | Discharge status: Home / Self Care | Payer: Medicare PPO | Source: Ambulatory Visit | Attending: "Endocrinology | Admitting: "Endocrinology

## 2023-06-26 DIAGNOSIS — E890 Postprocedural hypothyroidism: Secondary | ICD-10-CM | POA: Insufficient documentation

## 2023-07-10 ENCOUNTER — Telehealth: Payer: Self-pay | Admitting: "Endocrinology

## 2023-07-10 DIAGNOSIS — E890 Postprocedural hypothyroidism: Secondary | ICD-10-CM

## 2023-07-10 NOTE — Telephone Encounter (Signed)
Please update labs

## 2023-07-10 NOTE — Telephone Encounter (Signed)
Labs updated and sent to [facility].

## 2023-07-11 LAB — T4, FREE: Free T4: 1.45 ng/dL (ref 0.82–1.77)

## 2023-07-11 LAB — TSH: TSH: 2.73 u[IU]/mL (ref 0.450–4.500)

## 2023-07-17 ENCOUNTER — Encounter: Payer: Self-pay | Admitting: "Endocrinology

## 2023-07-17 ENCOUNTER — Ambulatory Visit: Payer: Medicare PPO | Admitting: "Endocrinology

## 2023-07-17 VITALS — BP 114/74 | HR 76 | Ht 59.0 in | Wt 146.4 lb

## 2023-07-17 DIAGNOSIS — E890 Postprocedural hypothyroidism: Secondary | ICD-10-CM

## 2023-07-17 DIAGNOSIS — E042 Nontoxic multinodular goiter: Secondary | ICD-10-CM | POA: Diagnosis not present

## 2023-07-17 MED ORDER — LEVOTHYROXINE SODIUM 75 MCG PO TABS: ORAL_TABLET | ORAL | 3 refills | Status: DC

## 2023-07-17 NOTE — Progress Notes (Signed)
07/17/2023, 11:28 AM                   Endocrinology follow-up note   Subjective:    Patient ID: Jody Howe, female    DOB: 1945/11/10, PCP Oneal Grout, FNP   Past Medical History:  Diagnosis Date   Allergic rhinitis    Arthritis    Cancer ([facility])    basal cell carcinoma   Constipation    Cystocele    mild on exam   GERD (gastroesophageal reflux disease)    Goiter    Hyperlipidemia    Medical history non-contributory    Osteoporosis    Plantar fasciitis    Past Surgical History:  Procedure Laterality Date   BASAL CELL CARCINOMA EXCISION     CATARACT EXTRACTION     COLONOSCOPY WITH PROPOFOL N/A 02/15/2016   Procedure: COLONOSCOPY WITH PROPOFOL;  Surgeon: Wallace Cullens, MD;  Location: [facility] ENDOSCOPY;  Service: Gastroenterology;  Laterality: N/A;   COLONOSCOPY WITH PROPOFOL N/A 01/10/2022   Procedure: COLONOSCOPY WITH PROPOFOL;  Surgeon: Regis Bill, MD;  Location: [facility] ENDOSCOPY;  Service: Endoscopy;  Laterality: N/A;   NOSE SURGERY     excision and flap - [facility] left nose   RETINAL LASER PROCEDURE  10/08   laster retinal surgery   TONSILLECTOMY     Social History   Socioeconomic History   Marital status: Married    Spouse name: Not on file   Number of children: Not on file   Years of education: Not on file   Highest education level: Not on file  Occupational History   Not on file  Tobacco Use   Smoking status: Never   Smokeless tobacco: Never  Vaping Use   Vaping status: Never Used  Substance and Sexual Activity   Alcohol use: No   Drug use: No   Sexual activity: Not on file  Other Topics Concern   Not on file  Social History Narrative   Not on file   Social Determinants of Health   Financial Resource Strain: Not on file  Food Insecurity: Not on file  Transportation Needs: Not on file  Physical Activity: Not on file  Stress: Not on file  Social Connections: Not on file   Outpatient  Encounter Medications as of 07/17/2023  Medication Sig   Aspirin Buf,CaCarb-MgCarb-MgO, (BUFFERED ASPIRIN PO) Take 225 mg by mouth 2 (two) times daily.   Calcium Carb-Cholecalciferol (CALCIUM 600+D) 600-800 MG-UNIT TABS Take 1 tablet by mouth daily.   Cholecalciferol (VITAMIN D3) 125 MCG (5000 UT) CAPS Take 1 capsule by mouth daily.   denosumab (PROLIA) 60 MG/ML SOLN injection Inject 60 mg into the skin every 6 (six) months. Administer in upper arm, thigh, or abdomen   docusate sodium (COLACE) 100 MG capsule Take 2 capsules by mouth 2 (two) times daily.   Glucosamine-Chondroitin (GLUCOSAMINE CHONDR COMPLEX PO) Take 1 tablet by mouth 2 (two) times daily.   levothyroxine (SYNTHROID) 75 MCG tablet TAKE ONE TABLET BY MOUTH EVERY MORNING BEFORE BREAKFAST   Psyllium Husk POWD Take by mouth.   rosuvastatin (CRESTOR) 10 MG tablet Take 1 tablet by mouth daily.   [DISCONTINUED] Ascorbic Acid (VITAMIN C) 500 MG CAPS Take  1 capsule by mouth 2 (two) times daily. (Patient not taking: Reported on 07/07/2022)   [DISCONTINUED] aspirin EC 81 MG tablet Take 81 mg by mouth daily as needed. Swallow whole. (Patient not taking: Reported on 07/07/2022)   [DISCONTINUED] levothyroxine (SYNTHROID) 75 MCG tablet TAKE ONE TABLET BY MOUTH EVERY MORNING BEFORE BREAKFAST   No facility-administered encounter medications on file as of 07/17/2023.   ALLERGIES: Allergies  Allergen Reactions   Alpha-Gal    Ceftin [Cefuroxime Axetil] Other (See Comments)   Other Hives    No gel caps   Sulfa Antibiotics Hives    VACCINATION STATUS: Immunization History  Administered Date(s) Administered   Influenza-Unspecified 07/09/2014    HPI GENIYAH Howe is 77 y.o. female who presents today with a medical history as above. 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    Her treatments were administered in February 2019 and  June 2019.   -She is currently on levothyroxine 75 mcg p.o. daily before breakfast.   She reports compliance with medication.  She reports steady weight, no new complaints today.  She reports better consistent energy level.  She has no new complaints today.  she denies family history of thyroid dysfunction. No family history of thyroid cancer. - Around 2013, she was found to have cold nodule on her right thyroid which reportedly was biopsied and showed benign findings.   - Her repeat thyroid ultrasound shows decreasing size of thyroid lobes and bilateral thyroid nodules.  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 -Her previsit surveillance ultrasound is unremarkable.  - She has history of osteoporosis on prolia treatment.  Review of Systems Limited as above.  Objective:    BP 114/74   Pulse 76   Ht 4\' 11"  (1.499 m)   Wt 146 lb 6.4 oz (66.4 kg)   BMI 29.57 kg/m   Wt Readings from Last 3 Encounters:  07/17/23 146 lb 6.4 oz (66.4 kg)  07/07/22 149 lb 3.2 oz (67.7 kg)  01/10/22 141 lb (64 kg)    Physical Exam    CMP     Component Value Date/Time   NA 138 07/26/2007 0931   K 3.8 07/26/2007 0931   CL 106 07/26/2007 0931   CO2 25 07/26/2007 0931   GLUCOSE 92 07/26/2007 0931   BUN 13 07/26/2007 0931   CREATININE 0.61 07/26/2007 0931   CALCIUM 8.6 07/26/2007 0931   GFRNONAA >60 07/26/2007 0931   GFRAA  07/26/2007 0931    >60        The eGFR has been calculated using the MDRD equation. This calculation has not been validated in all clinical   Review of her 2013 thyroid uptake and scan revealed a large cold nodule which was seen to arise from the inferior pole of the right lobe of the thyroid-reportedly biopsied with benign outcomes.  Cytology reports show that they were benign with some Hurthle cells.  Thyroid ultrasound from October 03, 2016 showed 4.4 cm right inferior pole solid thyroid nodule on the right lobe of the thyroid.   -This nodule appeared as a cold mass on the thyroid uptake and scan on October 26, 2017 which showed 43% uptake in 24 hours consistent with hyperthyroidism.  Fine-needle aspiration of dominant right-sided nodule of the thyroid on November 08, 2017 Diagnosis THYROID, FINE NEEDLE ASPIRATION, RIGHT (SPECIMEN 1 OF 1 COLLECTED 11/08/2017 CONSISTENT WITH BENIGN FOLLICULAR NODULE (BETHESDA CATEGORY II)  Recent Results (from the past 2160 hour(s))  T4, Free     Status: None   Collection Time: 07/10/23 10:45 AM  Result Value Ref Range   Free T4 1.45 0.82 - 1.77 ng/dL  TSH     Status: None   Collection Time: 07/10/23 10:45 AM  Result Value Ref Range   TSH 2.730 0.450 - 4.500 uIU/mL    Recent Results (from the past 2160 hour(s))  T4, Free     Status: None   Collection Time: 07/10/23 10:45 AM  Result Value Ref Range   Free T4 1.45 0.82 - 1.77 ng/dL  TSH     Status: None   Collection Time: 07/10/23 10:45 AM  Result Value Ref Range   TSH 2.730 0.450 - 4.500 uIU/mL    FINDINGS: Parenchymal Echotexture: Moderately heterogeneous   Isthmus: 0.2 cm   Right lobe: 4.4 x 2.6 x 3.0 cm   Left lobe: 2.7 x 1.3 x 1.2 cm  IMPRESSION: 1. Nodule 1 located in the isthmus does not meet criteria for FNA or surveillance. 2. Nodule 2 located in the inferior right thyroid lobe is not significantly changed in size since prior examination. Prior FNA performed in 2019.  Surveillance thyroid/neck ultrasound on June 26, 2023 FINDINGS: Parenchymal Echotexture: Markedly heterogenous   Isthmus: 0.1 cm   Right lobe: 4.4 cm x 2.5 cm x 3.4 cm,  Left lobe: 2.1 cm x 0.6 cm x 0.9 cm  IMPRESSION: Similar appearance of right thyroid nodule which has undergone prior biopsy as above.   Assessment & Plan:    1.  RAI induced hypothyroidism  -Her presentation now is consistent with appropriate replacement.  She is advised to continue levothyroxine 75 mcg p.o. daily before breakfast.    - We discussed  about the correct intake of her thyroid hormone, on empty stomach at fasting, with water, separated by at least 30 minutes from breakfast and other medications,  and separated by more than 4 hours from calcium, iron, multivitamins, acid reflux medications (PPIs). -Patient is made aware of the fact that thyroid hormone replacement is needed for life, dose to be adjusted by periodic monitoring of thyroid function tests.  2. Cold Nodule on right Lobe of Thyroid -negative biopsy for malignancy -Her previsit surveillance thyroid/neck ultrasound was significant for shrinking size of bilateral thyroid lobes and nodules.  She will not need intervention with biopsy or surgery at this time.   -Her previsit thyroid ultrasound is unremarkable.  - She wishes to follow up with her PCP for treatment of osteoporosis currently on prolia a status post 2 doses.   - I advised patient to maintain close follow up with Oneal Grout, FNP for primary care needs.   I spent  22  minutes in the care of the patient today including review of labs from Thyroid Function, CMP, and other relevant labs ; imaging/biopsy records (current and previous including abstractions from other facilities); face-to-face time discussing  her lab results and symptoms, medications doses, her options of short and long term treatment based on the latest standards of care / guidelines;   and documenting the encounter.  Jody Howe  participated in the discussions, expressed understanding, and voiced agreement with the above plans.  All questions were answered to her satisfaction. she is encouraged to contact clinic should she have any questions or concerns prior to her return visit.    Follow up plan: Return in about 1 year (around 07/16/2024) for F/U with Pre-visit Labs.  Marquis Lunch, MD [facility] [address] [place], [place] [address] Phone: [phone]  Fax: [phone]      07/17/2023, 11:28 AM  This note was partially dictated with voice recognition software. Similar sounding words can be transcribed inadequately or may not  be corrected upon review.

## 2023-07-19 ENCOUNTER — Other Ambulatory Visit (HOSPITAL_COMMUNITY): Payer: Self-pay | Admitting: Family Medicine

## 2023-07-19 DIAGNOSIS — Z1231 Encounter for screening mammogram for malignant neoplasm of breast: Secondary | ICD-10-CM

## 2023-08-17 ENCOUNTER — Encounter (HOSPITAL_COMMUNITY): Payer: Self-pay

## 2023-08-17 ENCOUNTER — Ambulatory Visit (HOSPITAL_COMMUNITY): Payer: Medicare PPO

## 2023-09-04 ENCOUNTER — Ambulatory Visit (HOSPITAL_COMMUNITY)
Admission: RE | Admit: 2023-09-04 | Discharge: 2023-09-04 | Disposition: A | Discharge status: Home / Self Care | Payer: Medicare PPO | Source: Ambulatory Visit | Attending: Family Medicine | Admitting: Family Medicine

## 2023-09-04 DIAGNOSIS — Z1231 Encounter for screening mammogram for malignant neoplasm of breast: Secondary | ICD-10-CM | POA: Insufficient documentation

## 2024-07-01 ENCOUNTER — Other Ambulatory Visit: Payer: Self-pay | Admitting: "Endocrinology

## 2024-07-10 LAB — TSH: TSH: 2.48 u[IU]/mL (ref 0.450–4.500)

## 2024-07-10 LAB — T4, FREE: Free T4: 1.3 ng/dL (ref 0.82–1.77)

## 2024-07-16 ENCOUNTER — Ambulatory Visit: Payer: Medicare PPO | Admitting: "Endocrinology

## 2024-07-16 ENCOUNTER — Encounter: Payer: Self-pay | Admitting: "Endocrinology

## 2024-07-16 VITALS — BP 128/76 | HR 64 | Ht <= 58 in | Wt 148.4 lb

## 2024-07-16 DIAGNOSIS — E890 Postprocedural hypothyroidism: Secondary | ICD-10-CM | POA: Diagnosis not present

## 2024-07-16 DIAGNOSIS — E042 Nontoxic multinodular goiter: Secondary | ICD-10-CM

## 2024-07-16 MED ORDER — LEVOTHYROXINE SODIUM 75 MCG PO TABS: 75.0000 ug | ORAL_TABLET | Freq: Every day | ORAL | 3 refills | Status: AC

## 2024-07-16 NOTE — Progress Notes (Signed)
 07/16/2024, 11:21 AM                   Endocrinology follow-up note   Subjective:    Patient ID: Jody Howe, female    DOB: 08-22-1946, PCP Myra Geni ORN, FNP   Past Medical History:  Diagnosis Date   Allergic rhinitis    Arthritis    Cancer (HCC)    basal cell carcinoma   Constipation    Cystocele    mild on exam   GERD (gastroesophageal reflux disease)    Goiter    Hyperlipidemia    Medical history non-contributory    Osteoporosis    Plantar fasciitis    Past Surgical History:  Procedure Laterality Date   BASAL CELL CARCINOMA EXCISION     CATARACT EXTRACTION     COLONOSCOPY WITH PROPOFOL  N/A 02/15/2016   Procedure: COLONOSCOPY WITH PROPOFOL ;  Surgeon: Deward CINDERELLA Piedmont, MD;  Location: [facility] ENDOSCOPY;  Service: Gastroenterology;  Laterality: N/A;   COLONOSCOPY WITH PROPOFOL  N/A 01/10/2022   Procedure: COLONOSCOPY WITH PROPOFOL ;  Surgeon: Maryruth Ole DASEN, MD;  Location: [facility] ENDOSCOPY;  Service: Endoscopy;  Laterality: N/A;   NOSE SURGERY     excision and flap - [facility] left nose   RETINAL LASER PROCEDURE  10/08   laster retinal surgery   TONSILLECTOMY     Social History   Socioeconomic History   Marital status: Married    Spouse name: Not on file   Number of children: Not on file   Years of education: Not on file   Highest education level: Not on file  Occupational History   Not on file  Tobacco Use   Smoking status: Never   Smokeless tobacco: Never  Vaping Use   Vaping status: Never Used  Substance and Sexual Activity   Alcohol use: No   Drug use: No   Sexual activity: Not on file  Other Topics Concern   Not on file  Social History Narrative   Not on file   Social Drivers of Health   Financial Resource Strain: Not on file  Food Insecurity: Not on file  Transportation Needs: Not on file  Physical Activity: Not on file  Stress: Not on file  Social Connections: Not on file   Outpatient  Encounter Medications as of 07/16/2024  Medication Sig   Aspirin Buf,CaCarb-MgCarb-MgO, (BUFFERED ASPIRIN PO) Take 225 mg by mouth 2 (two) times daily.   Calcium Carb-Cholecalciferol (CALCIUM 600+D) 600-800 MG-UNIT TABS Take 1 tablet by mouth daily.   Cholecalciferol (VITAMIN D3) 125 MCG (5000 UT) CAPS Take 1 capsule by mouth daily.   denosumab (PROLIA) 60 MG/ML SOLN injection Inject 60 mg into the skin every 6 (six) months. Administer in upper arm, thigh, or abdomen   docusate sodium (COLACE) 100 MG capsule Take 2 capsules by mouth 2 (two) times daily.   Glucosamine-Chondroitin (GLUCOSAMINE CHONDR COMPLEX PO) Take 1 tablet by mouth 2 (two) times daily.   levothyroxine  (SYNTHROID ) 75 MCG tablet Take 1 tablet (75 mcg total) by mouth daily with breakfast.   Psyllium Husk POWD Take by mouth.   rosuvastatin (CRESTOR) 10 MG tablet Take 1 tablet by mouth daily.   [DISCONTINUED] levothyroxine  (SYNTHROID ) 75 MCG tablet TAKE  ONE TABLET BY MOUTH EVERY MORNING BEFORE BREAKFAST   No facility-administered encounter medications on file as of 07/16/2024.   ALLERGIES: Allergies  Allergen Reactions   Alpha-Gal    Ceftin [Cefuroxime Axetil] Other (See Comments)   Ciprofloxacin Hives   Other Hives    No gel caps   Sulfa Antibiotics Hives    VACCINATION STATUS: Immunization History  Administered Date(s) Administered   Influenza-Unspecified 07/09/2014    HPI Jody Howe is 78 y.o. female who presents today with a medical history as above. 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    Her treatments were administered in February 2019 and June 2019.   -She is currently on a stable dose of levothyroxine  75 mcg p.o. daily before breakfast.  She reports compliance with her medication, previsit labs consistent with appropriate replacement.  She has no new complaints today.   she denies family  history of thyroid  dysfunction. No family history of thyroid  cancer. - Around 2013, she was found to have cold nodule on her right thyroid  which reportedly was biopsied and showed benign findings.   - Her repeat thyroid  ultrasound shows decreasing size of thyroid  lobes and bilateral thyroid  nodules.  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 -Her previsit surveillance ultrasound is unremarkable.  - She has history of osteoporosis on prolia treatment via her PMD. She wishes to take her thyroid  care to her primary care doctor as well.  Review of Systems Limited as above.  Objective:    BP 128/76   Pulse 64   Ht 4' 10 (1.473 m)   Wt 148 lb 6.4 oz (67.3 kg)   BMI 31.02 kg/m   Wt Readings from Last 3 Encounters:  07/16/24 148 lb 6.4 oz (67.3 kg)  07/17/23 146 lb 6.4 oz (66.4 kg)  07/07/22 149 lb 3.2 oz (67.7 kg)    Physical Exam    CMP     Component Value Date/Time   NA 138 07/26/2007 0931   K 3.8 07/26/2007 0931   CL 106 07/26/2007 0931   CO2 25 07/26/2007 0931   GLUCOSE 92 07/26/2007 0931   BUN 13 07/26/2007 0931   CREATININE 0.61 07/26/2007 0931   CALCIUM 8.6 07/26/2007 0931   GFRNONAA >60 07/26/2007 0931   GFRAA  07/26/2007 0931    >60        The eGFR has been calculated using the MDRD equation. This calculation has not been validated in all clinical   Review of her 2013 thyroid  uptake and scan revealed a large cold nodule which was seen to arise from the inferior pole of the right lobe of the thyroid -reportedly biopsied with benign outcomes.  Cytology reports show that they were benign with some Hurthle cells.  Thyroid  ultrasound from October 03, 2016 showed 4.4 cm right inferior pole solid thyroid  nodule on the right lobe of the thyroid .  -This nodule appeared as a cold mass on the thyroid  uptake and scan on October 26, 2017 which showed 43% uptake in 24 hours consistent with hyperthyroidism.  Fine-needle aspiration  of dominant right-sided nodule of the thyroid  on November 08, 2017 Diagnosis THYROID , FINE NEEDLE ASPIRATION, RIGHT (SPECIMEN 1 OF 1 COLLECTED 11/08/2017 CONSISTENT WITH BENIGN FOLLICULAR NODULE (BETHESDA CATEGORY II)  Recent Results (from the past 2160 hours)  TSH     Status: None   Collection Time: 07/09/24 11:26 AM  Result Value Ref Range  TSH 2.480 0.450 - 4.500 uIU/mL  T4, free     Status: None   Collection Time: 07/09/24 11:26 AM  Result Value Ref Range   Free T4 1.30 0.82 - 1.77 ng/dL    Recent Results (from the past 2160 hours)  TSH     Status: None   Collection Time: 07/09/24 11:26 AM  Result Value Ref Range   TSH 2.480 0.450 - 4.500 uIU/mL  T4, free     Status: None   Collection Time: 07/09/24 11:26 AM  Result Value Ref Range   Free T4 1.30 0.82 - 1.77 ng/dL    FINDINGS: Parenchymal Echotexture: Moderately heterogeneous   Isthmus: 0.2 cm   Right lobe: 4.4 x 2.6 x 3.0 cm   Left lobe: 2.7 x 1.3 x 1.2 cm  IMPRESSION: 1. Nodule 1 located in the isthmus does not meet criteria for FNA or surveillance. 2. Nodule 2 located in the inferior right thyroid  lobe is not significantly changed in size since prior examination. Prior FNA performed in 2019.  Surveillance thyroid /neck ultrasound on June 26, 2023 FINDINGS: Parenchymal Echotexture: Markedly heterogenous   Isthmus: 0.1 cm   Right lobe: 4.4 cm x 2.5 cm x 3.4 cm,  Left lobe: 2.1 cm x 0.6 cm x 0.9 cm  IMPRESSION: Similar appearance of right thyroid  nodule which has undergone prior biopsy as above.   Assessment & Plan:    1.  RAI induced hypothyroidism  - Patient is status post radioactive iodine thyroid  ablation attempt on 2 separate occasions.  She is now on levothyroxine  treatment for RAI induced hypothyroidism.  Her presentation now is consistent with appropriate replacement.  She is advised to continue levothyroxine  75 mcg p.o. daily before breakfast.     - We discussed about the correct intake  of her thyroid  hormone, on empty stomach at fasting, with water, separated by at least 30 minutes from breakfast and other medications,  and separated by more than 4 hours from calcium, iron, multivitamins, acid reflux medications (PPIs). -Patient is made aware of the fact that thyroid  hormone replacement is needed for life, dose to be adjusted by periodic monitoring of thyroid  function tests.  2. Cold Nodule on right Lobe of Thyroid  -negative biopsy for malignancy -Her previsit surveillance thyroid /neck ultrasound was significant for shrinking size of bilateral thyroid  lobes and nodules.  She will not need intervention with biopsy or surgery at this time.   -Her thyroid /neck ultrasound in September 2024 has been unremarkable.    - She wishes to follow up with her PCP for subsequent thyroid  care and treatment of osteoporosis currently on prolia.  I gave her a left refills for levothyroxine  for a year. She will return to clinic only if needed.  - I advised patient to maintain close follow up with Myra Geni ORN, FNP for primary care needs.   I spent  20  minutes in the care of the patient today including review of labs from Thyroid  Function, CMP, and other relevant labs ; imaging/biopsy records (current and previous including abstractions from other facilities); face-to-face time discussing  her lab results and symptoms, medications doses, her options of short and long term treatment based on the latest standards of care / guidelines;   and documenting the encounter.  Jody Howe  participated in the discussions, expressed understanding, and voiced agreement with the above plans.  All questions were answered to her satisfaction. she is encouraged to contact clinic should she have any questions or concerns prior to her  return visit.   Follow up plan: Return if symptoms worsen or fail to improve.   Ranny Earl, MD [facility] [address] [place], [place] [address] Phone: [phone]  Fax: [phone]     07/16/2024, 11:21 AM  This note was partially dictated with voice recognition software. Similar sounding words can be transcribed inadequately or may not  be corrected upon review.

## 2024-08-01 ENCOUNTER — Other Ambulatory Visit (HOSPITAL_COMMUNITY): Payer: Self-pay | Admitting: Family Medicine

## 2024-08-01 DIAGNOSIS — Z1231 Encounter for screening mammogram for malignant neoplasm of breast: Secondary | ICD-10-CM

## 2024-09-04 ENCOUNTER — Ambulatory Visit (HOSPITAL_COMMUNITY)
Admission: RE | Admit: 2024-09-04 | Discharge: 2024-09-04 | Disposition: A | Discharge status: Home / Self Care | Source: Ambulatory Visit | Attending: Family Medicine | Admitting: Family Medicine

## 2024-09-04 DIAGNOSIS — Z1231 Encounter for screening mammogram for malignant neoplasm of breast: Secondary | ICD-10-CM | POA: Diagnosis present
